# Patient Record
Sex: Female | Born: 1985
Health system: Southern US, Community
[De-identification: ages and names within clinical notes are randomized; demographics above are authoritative.]

## PROBLEM LIST (undated history)

## (undated) DIAGNOSIS — K219 Gastro-esophageal reflux disease without esophagitis: Secondary | ICD-10-CM

---

## 2017-03-28 ENCOUNTER — Encounter (HOSPITAL_BASED_OUTPATIENT_CLINIC_OR_DEPARTMENT_OTHER): Payer: Self-pay

## 2017-03-28 ENCOUNTER — Emergency Department (HOSPITAL_BASED_OUTPATIENT_CLINIC_OR_DEPARTMENT_OTHER)
Admission: EM | Admit: 2017-03-28 | Discharge: 2017-03-28 | Disposition: A | Discharge status: Home / Self Care | Payer: Self-pay | Attending: Emergency Medicine | Admitting: Emergency Medicine

## 2017-03-28 DIAGNOSIS — N912 Amenorrhea, unspecified: Secondary | ICD-10-CM | POA: Insufficient documentation

## 2017-03-28 DIAGNOSIS — B9689 Other specified bacterial agents as the cause of diseases classified elsewhere: Secondary | ICD-10-CM

## 2017-03-28 DIAGNOSIS — N760 Acute vaginitis: Secondary | ICD-10-CM | POA: Insufficient documentation

## 2017-03-28 HISTORY — DX: Gastro-esophageal reflux disease without esophagitis: K21.9

## 2017-03-28 LAB — URINALYSIS, ROUTINE W REFLEX MICROSCOPIC
Bilirubin Urine: NEGATIVE
Glucose, UA: NEGATIVE mg/dL
Hgb urine dipstick: NEGATIVE
Ketones, ur: NEGATIVE mg/dL
Leukocytes, UA: NEGATIVE
NITRITE: NEGATIVE
PH: 6.5 (ref 5.0–8.0)
Protein, ur: NEGATIVE mg/dL
SPECIFIC GRAVITY, URINE: 1.015 (ref 1.005–1.030)

## 2017-03-28 LAB — WET PREP, GENITAL
SPERM: NONE SEEN
Trich, Wet Prep: NONE SEEN
YEAST WET PREP: NONE SEEN

## 2017-03-28 LAB — PREGNANCY, URINE: PREG TEST UR: NEGATIVE

## 2017-03-28 MED ORDER — METRONIDAZOLE 0.75 % VA GEL: 1.0000 | Freq: Two times a day (BID) | VAGINAL | 0 refills | Status: AC

## 2017-03-28 NOTE — ED Triage Notes (Signed)
C/o abd pain x 2 months-denies n/v/d-unknown LMP-NAD-steady gait

## 2017-03-28 NOTE — Discharge Instructions (Signed)
Use MetroGel twice a day for 5 days. You may use Tylenol or ibuprofen as needed for cramping. You may use hot packs to help with symptom control. [facility]. Return to the emergency room if you develop fever, chills, change in bowel or bladder habits, or any new or worsening symptoms.

## 2017-03-30 NOTE — ED Provider Notes (Signed)
[facility]-EMERGENCY DEPT Provider Note   CSN: [id number] Arrival date & time: 03/28/17  1319     History   Chief Complaint Chief Complaint  Patient presents with  . Abdominal Pain    HPI Sherry [name] is a 31 y.o. female presenting with lower abd cramping.   Pt states that at the end of last month when she was supposed to have period, she had lower abd cramping, and her period didn't come. This lasted about a wek. Again this month, she has had lower abd cramping, and no period. She states the cramping is constant, not worse on one side. She normally has regular periods. She is not on birth control pills, implant or IUD. She is sexually active with one female partner. She was checked for STDs 2 months ago prior to moving to [facility], and everything was negative. She reports some irritation after using toys during sex, just prior to the cramping started. She reports some vaginal odor, but no discharge. She denies fevers, chills, CP, SOB, nausea, vomiting, urinary sxs or abnormal BMs. She has no other medical problems.   HPI  Past Medical History:  Diagnosis Date  . GERD (gastroesophageal reflux disease)     There are no active problems to display for this patient.   History reviewed. No pertinent surgical history.  OB History    No data available       Home Medications    Prior to Admission medications   Medication Sig Start Date End Date Taking? Authorizing Provider  metroNIDAZOLE (METROGEL VAGINAL) 0.75 % vaginal gel Place 1 Applicatorful vaginally 2 (two) times daily. 03/28/17 04/02/17  Janilah Hojnacki, PA-C    Family History No family history on file.  Social History Social History  Substance Use Topics  . Smoking status: Never Smoker  . Smokeless tobacco: Never Used  . Alcohol use No     Allergies   Patient has no known allergies.   Review of Systems Review of Systems  Gastrointestinal: Positive for abdominal pain. Negative for constipation, diarrhea, nausea  and vomiting.  Genitourinary: Negative for dysuria, frequency, hematuria and vaginal discharge.       Vaginal odor     Physical Exam Updated Vital Signs BP 120/65 (BP Location: Left Arm)   Pulse 88   Temp 98.8 F (37.1 C) (Oral)   Resp 18   Ht  (1.651 m)   Wt 93.2 kg (205 lb 7.5 oz)   LMP  (LMP Unknown)   SpO2 100%   BMI 34.19 kg/m   Physical Exam  Constitutional: She is oriented to person, place, and time. She appears well-developed and well-nourished. No distress.  HENT:  Head: Normocephalic and atraumatic.  Eyes: EOM are normal.  Neck: Normal range of motion.  Cardiovascular: Normal rate, regular rhythm and intact distal pulses.   Pulmonary/Chest: Effort normal and breath sounds normal. No respiratory distress. She has no wheezes.  Abdominal: Soft. Normal appearance. She exhibits no distension. There is tenderness in the suprapubic area. There is no rigidity, no rebound, no guarding, no CVA tenderness, no tenderness at McBurney's point and negative Murphy's sign.  Minimal ttp of suprapubic area  Genitourinary: Rectum normal, vagina normal and uterus normal. Pelvic exam was performed with patient supine. There is no rash, tenderness or lesion on the right labia. There is no rash, tenderness or lesion on the left labia. Cervix exhibits no motion tenderness, no discharge and no friability. Right adnexum displays no mass, no [id number] fullness. Left adnexum  displays no mass, no tenderness and no fullness.  Genitourinary Comments: Chaperone present. No vaginal discharge. No CMT. No lesions or masses noted.   Musculoskeletal: Normal range of motion.  Neurological: She is alert and oriented to person, place, and time.  Skin: Skin is warm. No rash noted.  Psychiatric: She has a normal mood and affect.  Nursing note and vitals reviewed.    ED Treatments / Results  Labs (all labs ordered are listed, but only abnormal results are displayed) Labs Reviewed  WET PREP,  GENITAL - Abnormal; Notable for the following:       Result Value   Clue Cells Wet Prep HPF POC PRESENT (*)    WBC, Wet Prep HPF POC FEW (*)    All other components within normal limits  URINALYSIS, ROUTINE W REFLEX MICROSCOPIC  PREGNANCY, URINE    EKG  EKG Interpretation None       Radiology No results found.  Procedures Procedures (including critical care time)  Medications Ordered in ED Medications - No data to display   Initial Impression / Assessment and Plan / ED Course  I have reviewed the triage vital signs and the nursing notes.  Pertinent labs & imaging results that were available during my care of the patient were reviewed by me and considered in my medical decision making (see chart for details).     Pt presenting with lower abd cramping x1 wk, with similar episode last month. Additionally, she has not had a period in 2 months, and she has vaginal odor. She does not want to be tested for STDs today, as she had recent testing. Will order UA, pregnancy, and wet prep.  Pelvic without CMT or pain, doubt PID. Doubt torsion. Doubt abd etiology. Wet prep positive for clue cells. UA negative for uti and pregnancy negative. Will rx metrogel for BV. Discussed with pt. Pt to f/u with ob/gyn regarding amenorrhea and for further evaluation. At this time, pt appears safe for discharge. Return precautions given. Pt state she understands and agrees to plan.   Final Clinical Impressions(s) / ED Diagnoses   Final diagnoses:  BV (bacterial vaginosis)  Amenorrhea    New Prescriptions Discharge Medication List as of 03/28/2017  4:01 PM    START taking these medications   Details  metroNIDAZOLE (METROGEL VAGINAL) 0.75 % vaginal gel Place 1 Applicatorful vaginally 2 (two) times daily., Starting Mon 03/28/2017, Until Sat 04/02/2017, Print         Hollister, [place], PA-C 03/30/17 1657    Derwood Kaplan, MD 03/30/17 1814

## 2017-04-01 MED FILL — metroNIDAZOLE 0.75 % GEL: 0.75 | 7 days supply | Qty: 70 | Fill #0

## 2017-08-24 ENCOUNTER — Other Ambulatory Visit: Payer: Self-pay

## 2017-08-24 ENCOUNTER — Emergency Department (HOSPITAL_BASED_OUTPATIENT_CLINIC_OR_DEPARTMENT_OTHER)
Admission: EM | Admit: 2017-08-24 | Discharge: 2017-08-24 | Disposition: A | Discharge status: Home / Self Care | Payer: Self-pay | Attending: Emergency Medicine | Admitting: Emergency Medicine

## 2017-08-24 ENCOUNTER — Encounter (HOSPITAL_BASED_OUTPATIENT_CLINIC_OR_DEPARTMENT_OTHER): Payer: Self-pay | Admitting: Emergency Medicine

## 2017-08-24 DIAGNOSIS — N939 Abnormal uterine and vaginal bleeding, unspecified: Secondary | ICD-10-CM | POA: Insufficient documentation

## 2017-08-24 DIAGNOSIS — R69 Illness, unspecified: Secondary | ICD-10-CM

## 2017-08-24 DIAGNOSIS — J111 Influenza due to unidentified influenza virus with other respiratory manifestations: Secondary | ICD-10-CM | POA: Insufficient documentation

## 2017-08-24 LAB — URINALYSIS, ROUTINE W REFLEX MICROSCOPIC
Bilirubin Urine: NEGATIVE
GLUCOSE, UA: NEGATIVE mg/dL
Ketones, ur: NEGATIVE mg/dL
LEUKOCYTES UA: NEGATIVE
Nitrite: NEGATIVE
PH: 6 (ref 5.0–8.0)
Protein, ur: 30 mg/dL — AB
Specific Gravity, Urine: 1.015 (ref 1.005–1.030)

## 2017-08-24 LAB — PREGNANCY, URINE: Preg Test, Ur: NEGATIVE

## 2017-08-24 LAB — WET PREP, GENITAL
SPERM: NONE SEEN
TRICH WET PREP: NONE SEEN
Yeast Wet Prep HPF POC: NONE SEEN

## 2017-08-24 LAB — URINALYSIS, MICROSCOPIC (REFLEX)

## 2017-08-24 MED ORDER — METRONIDAZOLE 0.75 % VA GEL: 1.0000 | Freq: Two times a day (BID) | VAGINAL | 0 refills | Status: DC

## 2017-08-24 MED ORDER — ONDANSETRON 4 MG PO TBDP: 4.0000 mg | ORAL_TABLET | Freq: Three times a day (TID) | ORAL | 0 refills | Status: DC | PRN

## 2017-08-24 NOTE — ED Provider Notes (Signed)
[facility] EMERGENCY DEPARTMENT Provider Note   CSN: [id number] Arrival date & time: 08/24/17  1524     History   Chief Complaint Chief Complaint  Patient presents with  . Emesis  . Sore Throat  . Generalized Body Aches    HPI Sherry [name] is a 32 y.o. female.  Patient presents to the emergency department with complaint of sore throat, body aches, fever, vomiting starting yesterday.  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  She has had a nonproductive cough.  Also fatigue and generalized weakness.  No treatments prior to arrival.  She received a flu shot 2 weeks ago.  Mild eye drainage. The onset of this condition was acute. The course is constant. Aggravating factors: none. Alleviating factors: none.   Patient also reports some mild vaginal bleeding.  She is concerned about sexually transmitted infection and BV.  States that she is not expecting her period to start for another week or so.      Past Medical History:  Diagnosis Date  . GERD (gastroesophageal reflux disease)     There are no active problems to display for this patient.   History reviewed. No pertinent surgical history.  OB History    No data available       Home Medications    Prior to Admission medications   Not on File    Family History History reviewed. No pertinent family history.  Social History Social History   Tobacco Use  . Smoking status: Never Smoker  . Smokeless tobacco: Never Used  Substance Use Topics  . Alcohol use: No  . Drug use: No     Allergies   Patient has no known allergies.   Review of Systems Review of Systems  Constitutional: Positive for chills, fatigue and fever.  HENT: Positive for congestion and sore throat. Negative for ear pain, rhinorrhea and sinus pressure.   Eyes: Negative for redness.  Respiratory: Positive for cough. Negative for wheezing.   Gastrointestinal: Positive for nausea and vomiting. Negative for  abdominal pain and diarrhea.  Genitourinary: Positive for vaginal bleeding. Negative for dysuria and vaginal discharge.  Musculoskeletal: Positive for myalgias. Negative for neck stiffness.  Skin: Negative for rash.  Neurological: Negative for headaches.  Hematological: Negative for adenopathy.     Physical Exam Updated Vital Signs BP 122/73 (BP Location: Left Arm)   Pulse (!) 105   Temp (!) 100.6 F (38.1 C) (Oral)   Resp 20   Ht 5\' 5"  (1.651 m)   Wt 85.7 kg (189 lb)   LMP 08/23/2017   SpO2 100%   BMI 31.45 kg/m   Physical Exam  Constitutional: She appears well-developed and well-nourished.  HENT:  Head: Normocephalic and atraumatic.  Right Ear: Tympanic membrane, external ear and ear canal normal.  Left Ear: Tympanic membrane, external ear and ear canal normal.  Nose: Nose normal. No mucosal edema or rhinorrhea.  Mouth/Throat: Uvula is midline and mucous membranes are normal. Mucous membranes are not dry. No oral lesions. No trismus in the jaw. No uvula swelling. Posterior oropharyngeal erythema present. No oropharyngeal exudate, posterior oropharyngeal edema or tonsillar abscesses.  Eyes: Conjunctivae are normal. Right eye exhibits no discharge. Left eye exhibits no discharge.  Neck: Normal range of motion. Neck supple.  Cardiovascular: Normal rate, regular rhythm and normal heart sounds.  Pulmonary/Chest: Effort normal and breath sounds normal. No respiratory distress. She has no wheezes. She has no rales.  Abdominal: Soft. There is no tenderness. There is  no rebound and no guarding.  Genitourinary: Uterus normal. Pelvic exam was performed with patient supine. There is no rash, tenderness or lesion on the right labia. There is no rash, tenderness or lesion on the left labia. Uterus is not tender. Cervix exhibits no motion tenderness, no discharge and no friability. Right adnexum displays no mass and no tenderness. Left adnexum displays no mass and no tenderness. There is  bleeding (scant) in the vagina. No erythema or tenderness in the vagina. No signs of injury around the vagina. No vaginal discharge found.  Lymphadenopathy:    She has no cervical adenopathy.  Neurological: She is alert.  Skin: Skin is warm and dry.  Psychiatric: She has a normal mood and affect.  Nursing note and vitals reviewed.    ED Treatments / Results  Labs (all labs ordered are listed, but only abnormal results are displayed) Labs Reviewed  WET PREP, GENITAL - Abnormal; Notable for the following components:      Result Value   Clue Cells Wet Prep HPF POC PRESENT (*)    WBC, Wet Prep HPF POC MODERATE (*)    All other components within normal limits  URINALYSIS, ROUTINE W REFLEX MICROSCOPIC - Abnormal; Notable for the following components:   Hgb urine dipstick MODERATE (*)    Protein, ur 30 (*)    All other components within normal limits  URINALYSIS, MICROSCOPIC (REFLEX) - Abnormal; Notable for the following components:   Bacteria, UA RARE (*)    Squamous Epithelial / LPF 0-5 (*)    All other components within normal limits  PREGNANCY, URINE  GC/CHLAMYDIA PROBE AMP ([place]    Procedures Procedures (including critical care time)  Medications Ordered in ED Medications - No data to display   Initial Impression / Assessment and Plan / ED Course  I have reviewed the triage vital signs and the nursing notes.  Pertinent labs & imaging results that were available during my care of the patient were reviewed by me and considered in my medical decision making (see chart for details).     Patient seen and examined. Work-up initiated.  Will perform pelvic exam.  Symptoms consistent with flu.  Vital signs reviewed and are as follows: BP 122/73 (BP Location: Left Arm)   Pulse (!) 105   Temp (!) 100.6 F (38.1 C) (Oral)   Resp 20   Ht 5\' 5"  (1.651 m)   Wt 85.7 kg (189 lb)   LMP 08/23/2017   SpO2 100%   BMI 31.45 kg/m   Pelvic exam by [place] PA-S  under my direct supervision.  Wet prep with clue cells.  Will give prescription for bacterial vaginosis to fill if patient develops worsening vaginal discharge.  Otherwise would not fill.  Patient discharged to home. Encouraged to rest and drink plenty of fluids.  Patient told to return to ED or see their primary doctor if their symptoms worsen, high fever not controlled with tylenol, persistent vomiting, they feel they are dehydrated, or if they have any other concerns.  Patient verbalized understanding and agreed with plan.     Final Clinical Impressions(s) / ED Diagnoses   Final diagnoses:  Influenza-like illness  Vaginal bleeding   ILI: Patient with symptoms consistent with influenza. Vitals are stable, low-grade fever. No signs of dehydration, tolerating PO's. Lungs are clear. Supportive therapy indicated with return if symptoms worsen. Patient counseled.  Vaginal bleeding: Mild.  Not pregnant.  No pain on exam.  Possible menstrual bleeding?  Less likely BV.  Testing for cervicitis pending.  Patient to follow-up with her GYN if symptoms persist.  No indication for imaging at this point.    ED Discharge Orders        Ordered    metroNIDAZOLE (METROGEL) 0.75 % vaginal gel  2 times daily     08/24/17 1709    ondansetron (ZOFRAN ODT) 4 MG disintegrating tablet  Every 8 hours PRN     08/24/17 1709       Renne Crigler, PA-C 08/24/17 1719    Charlynne Pander, MD 08/24/17 2226

## 2017-08-24 NOTE — Discharge Instructions (Signed)
Please read and follow all provided instructions.  Your diagnoses today include:  1. Influenza-like illness   2. Vaginal bleeding     Tests performed today include:  Wet prep  Urine test -no infection  Test for gonorrhea and chlamydia -negative  Vital signs. See below for your results today.   Medications prescribed:   Metronidazole gel -medication for bacterial vaginosis, fill this and take it if you develop vaginal discharge   Zofran (ondansetron) - for nausea and vomiting  Take any prescribed medications only as directed.  Home care instructions:  Follow any educational materials contained in this packet. Please continue drinking plenty of fluids. Use over-the-counter cold and flu medications as needed as directed on packaging for symptom relief. You may also use ibuprofen or tylenol as directed on packaging for pain or fever.   BE VERY CAREFUL not to take multiple medicines containing Tylenol (also called acetaminophen). Doing so can lead to an overdose which can damage your liver and cause liver failure and possibly death.   Follow-up instructions: Please follow-up with your primary care provider in the next 3 days for further evaluation of your symptoms.   Return instructions:   Please return to the Emergency Department if you experience worsening symptoms.  Please return if you have a high fever greater than 101 degrees not controlled with over-the-counter medications, persistent vomiting and cannot keep down fluids, or worsening trouble breathing.  Please return if you have any other emergent concerns.  Additional Information:  Your vital signs today were: BP 122/73 (BP Location: Left Arm)    Pulse (!) 105    Temp (!) 100.6 F (38.1 C) (Oral)    Resp 20    Ht 5\' 5"  (1.651 m)    Wt 85.7 kg (189 lb)    LMP 08/23/2017    SpO2 100%    BMI 31.45 kg/m  If your blood pressure (BP) was elevated above 135/85 this visit, please have this repeated by your doctor within  one month.

## 2017-08-24 NOTE — ED Triage Notes (Signed)
Pt reports emesis and sore throat last night, flu shot 2 weeks ago. Reports has  Not been feeling well since the vaccine. Gl body aches. Eye drainage. Fever last night

## 2017-08-24 NOTE — ED Notes (Signed)
NAD at this time. Pt is stable and going home.  

## 2017-08-25 LAB — GC/CHLAMYDIA PROBE AMP (~~LOC~~) NOT AT ARMC
Chlamydia: NEGATIVE
NEISSERIA GONORRHEA: NEGATIVE

## 2018-09-04 ENCOUNTER — Encounter (HOSPITAL_BASED_OUTPATIENT_CLINIC_OR_DEPARTMENT_OTHER): Payer: Self-pay | Admitting: *Deleted

## 2018-09-04 ENCOUNTER — Emergency Department (HOSPITAL_BASED_OUTPATIENT_CLINIC_OR_DEPARTMENT_OTHER): Payer: Self-pay

## 2018-09-04 ENCOUNTER — Other Ambulatory Visit: Payer: Self-pay

## 2018-09-04 ENCOUNTER — Emergency Department (HOSPITAL_BASED_OUTPATIENT_CLINIC_OR_DEPARTMENT_OTHER)
Admission: EM | Admit: 2018-09-04 | Discharge: 2018-09-04 | Disposition: A | Discharge status: Home / Self Care | Payer: Self-pay | Attending: Emergency Medicine | Admitting: Emergency Medicine

## 2018-09-04 DIAGNOSIS — B9789 Other viral agents as the cause of diseases classified elsewhere: Secondary | ICD-10-CM | POA: Insufficient documentation

## 2018-09-04 DIAGNOSIS — R0602 Shortness of breath: Secondary | ICD-10-CM | POA: Insufficient documentation

## 2018-09-04 DIAGNOSIS — R51 Headache: Secondary | ICD-10-CM | POA: Insufficient documentation

## 2018-09-04 DIAGNOSIS — F17200 Nicotine dependence, unspecified, uncomplicated: Secondary | ICD-10-CM | POA: Insufficient documentation

## 2018-09-04 DIAGNOSIS — R42 Dizziness and giddiness: Secondary | ICD-10-CM | POA: Insufficient documentation

## 2018-09-04 DIAGNOSIS — J069 Acute upper respiratory infection, unspecified: Secondary | ICD-10-CM

## 2018-09-04 MED ORDER — BENZONATATE 100 MG PO CAPS: 100.0000 mg | ORAL_CAPSULE | Freq: Three times a day (TID) | ORAL | 0 refills | Status: DC | PRN

## 2018-09-04 MED ORDER — ALBUTEROL SULFATE HFA 108 (90 BASE) MCG/ACT IN AERS
2.0000 | INHALATION_SPRAY | Freq: Once | RESPIRATORY_TRACT | Status: AC
  Administered 2018-09-04: 2 via RESPIRATORY_TRACT
  Filled 2018-09-04: qty 6.7

## 2018-09-04 MED ORDER — DEXAMETHASONE SODIUM PHOSPHATE 10 MG/ML IJ SOLN
10.0000 mg | Freq: Once | INTRAMUSCULAR | Status: AC
  Administered 2018-09-04: 10 mg via INTRAMUSCULAR
  Filled 2018-09-04: qty 1

## 2018-09-04 NOTE — ED Provider Notes (Signed)
Emergency Department Provider Note   I have reviewed the triage vital signs and the nursing notes.   HISTORY  Chief Complaint Cough   HPI Sherry [name] is a 33 y.o. female with PMH of GERD presents to the emergency department for evaluation of cough symptoms with headache, shortness of breath.  Symptoms have been ongoing for the past 2 weeks.  She describes minimally productive cough without hemoptysis.  No chest pain.  She reports some lightheadedness which is intermittent.  Mild headache at times.  No body aches.  No sick contacts.  No radiation of symptoms or other modifying factors.  Past Medical History:  Diagnosis Date  . GERD (gastroesophageal reflux disease)     There are no active problems to display for this patient.   History reviewed. No pertinent surgical history.  Allergies Patient has no known allergies.  No family history on file.  Social History Social History   Tobacco Use  . Smoking status: Current Some Day Smoker  . Smokeless tobacco: Never Used  Substance Use Topics  . Alcohol use: No  . Drug use: No    Review of Systems  Constitutional: No fever/chills Eyes: No visual changes. ENT: No sore throat. Cardiovascular: Denies chest pain. Respiratory: Positive shortness of breath. Positive cough and congestion.  Gastrointestinal: No abdominal pain.  No nausea, no vomiting.  No diarrhea.  No constipation. Genitourinary: Negative for dysuria. Musculoskeletal: Negative for back pain. Skin: Negative for rash. Neurological: Negative for focal weakness or numbness. Positive HA.   10-point ROS otherwise negative.  ____________________________________________   PHYSICAL EXAM:  VITAL SIGNS: ED Triage Vitals  Enc Vitals Group     BP 09/04/18 2106 139/90     Pulse Rate 09/04/18 2106 80     Resp 09/04/18 2106 14     Temp 09/04/18 2106 98.2 F (36.8 C)     Temp Source 09/04/18 2106 Oral     SpO2 09/04/18 2106 100 %     Weight 09/04/18 2102  190 lb (86.2 kg)     Height 09/04/18 2102 5\' 5"  (1.651 m)   Constitutional: Alert and oriented. Well appearing and in no acute distress. Eyes: Conjunctivae are normal. PERRL. Head: Atraumatic. Nose: Mild congestion/rhinnorhea. Mouth/Throat: Mucous membranes are moist.  Oropharynx non-erythematous. Neck: No stridor.  Cardiovascular: Normal rate, regular rhythm. Good peripheral circulation. Grossly normal heart sounds.   Respiratory: Normal respiratory effort.  No retractions. Lungs CTAB. Gastrointestinal: Soft and nontender. No distention.  Musculoskeletal: No lower extremity tenderness nor edema. No gross deformities of extremities. Neurologic:  Normal speech and language. No gross focal neurologic deficits are appreciated.  Skin:  Skin is warm, dry and intact. No rash noted.  ____________________________________________  RADIOLOGY  Dg Chest 2 View  Result Date: 09/04/2018 CLINICAL DATA:  Cough, headache, chills, shortness of breath EXAM: CHEST - 2 VIEW COMPARISON:  None. FINDINGS: Heart and mediastinal contours are within normal limits. No focal opacities or effusions. No acute bony abnormality. IMPRESSION: No active cardiopulmonary disease. Electronically Signed   By: Charlett Nose M.D.   On: 09/04/2018 23:00    ____________________________________________   PROCEDURES  Procedure(s) performed:   Procedures  None ____________________________________________   INITIAL IMPRESSION / ASSESSMENT AND PLAN / ED COURSE  Pertinent labs & imaging results that were available during my care of the patient were reviewed by me and considered in my medical decision making (see chart for details).  Patient presents with mild congestion, cough, headache, viral type symptoms for the past 2  weeks.  Chest x-ray obtained given prolonged symptoms which shows no infiltrate.  Suspect bronchitis clinically.  No indication for antibiotics.  I had a Avigdor Dollar discussion regarding the patient's smoking and  advised her that this is likely making her symptoms worse.  Patient is considering quitting but not ready to quit yet.  Offered Decadron and cough suppression medication along with inhaler.  Discussed need for PCP follow-up.  I do not have significant concern for PE or cardiac arrhythmia.  No evidence of infection.   ____________________________________________  FINAL CLINICAL IMPRESSION(S) / ED DIAGNOSES  Final diagnoses:  Viral URI with cough     MEDICATIONS GIVEN DURING THIS VISIT:  Medications  dexamethasone (DECADRON) injection 10 mg (10 mg Intramuscular Given 09/04/18 2321)  albuterol (PROVENTIL HFA;VENTOLIN HFA) 108 (90 Base) MCG/ACT inhaler 2 puff (2 puffs Inhalation Given 09/04/18 2324)     NEW OUTPATIENT MEDICATIONS STARTED DURING THIS VISIT:  Discharge Medication List as of 09/04/2018 11:13 PM    START taking these medications   Details  benzonatate (TESSALON) 100 MG capsule Take 1 capsule (100 mg total) by mouth 3 (three) times daily as needed for cough., Starting Mon 09/04/2018, Print        Note:  This document was prepared using Dragon voice recognition software and may include unintentional dictation errors.  Alona Bene, MD Emergency Medicine    Undray Allman, Arlyss Repress, MD 09/05/18 [id number]

## 2018-09-04 NOTE — ED Triage Notes (Signed)
Cough, headache, chills, and sob x 2 weeks.

## 2018-09-04 NOTE — Discharge Instructions (Signed)

## 2019-03-13 ENCOUNTER — Encounter (HOSPITAL_BASED_OUTPATIENT_CLINIC_OR_DEPARTMENT_OTHER): Payer: Self-pay | Admitting: Emergency Medicine

## 2019-03-13 ENCOUNTER — Emergency Department (HOSPITAL_BASED_OUTPATIENT_CLINIC_OR_DEPARTMENT_OTHER): Payer: Self-pay

## 2019-03-13 ENCOUNTER — Emergency Department (HOSPITAL_BASED_OUTPATIENT_CLINIC_OR_DEPARTMENT_OTHER)
Admission: EM | Admit: 2019-03-13 | Discharge: 2019-03-13 | Disposition: A | Discharge status: Home / Self Care | Payer: Self-pay | Attending: Emergency Medicine | Admitting: Emergency Medicine

## 2019-03-13 ENCOUNTER — Other Ambulatory Visit: Payer: Self-pay

## 2019-03-13 DIAGNOSIS — N12 Tubulo-interstitial nephritis, not specified as acute or chronic: Secondary | ICD-10-CM | POA: Insufficient documentation

## 2019-03-13 DIAGNOSIS — Z87891 Personal history of nicotine dependence: Secondary | ICD-10-CM | POA: Insufficient documentation

## 2019-03-13 LAB — WET PREP, GENITAL
Sperm: NONE SEEN
Trich, Wet Prep: NONE SEEN
WBC, Wet Prep HPF POC: NONE SEEN
Yeast Wet Prep HPF POC: NONE SEEN

## 2019-03-13 LAB — CBC WITH DIFFERENTIAL/PLATELET
Abs Immature Granulocytes: 0.05 10*3/uL (ref 0.00–0.07)
Basophils Absolute: 0.1 10*3/uL (ref 0.0–0.1)
Basophils Relative: 0 %
Eosinophils Absolute: 0.2 10*3/uL (ref 0.0–0.5)
Eosinophils Relative: 1 %
HCT: 32.2 % — ABNORMAL LOW (ref 36.0–46.0)
Hemoglobin: 9.7 g/dL — ABNORMAL LOW (ref 12.0–15.0)
Immature Granulocytes: 0 %
Lymphocytes Relative: 18 %
Lymphs Abs: 2.5 10*3/uL (ref 0.7–4.0)
MCH: 21.1 pg — ABNORMAL LOW (ref 26.0–34.0)
MCHC: 30.1 g/dL (ref 30.0–36.0)
MCV: 70.2 fL — ABNORMAL LOW (ref 80.0–100.0)
Monocytes Absolute: 1.1 10*3/uL — ABNORMAL HIGH (ref 0.1–1.0)
Monocytes Relative: 8 %
Neutro Abs: 10 10*3/uL — ABNORMAL HIGH (ref 1.7–7.7)
Neutrophils Relative %: 73 %
Platelets: 410 10*3/uL — ABNORMAL HIGH (ref 150–400)
RBC: 4.59 MIL/uL (ref 3.87–5.11)
RDW: 19.8 % — ABNORMAL HIGH (ref 11.5–15.5)
WBC: 13.8 10*3/uL — ABNORMAL HIGH (ref 4.0–10.5)
nRBC: 0 % (ref 0.0–0.2)

## 2019-03-13 LAB — BASIC METABOLIC PANEL
Anion gap: 8 (ref 5–15)
BUN: 8 mg/dL (ref 6–20)
CO2: 25 mmol/L (ref 22–32)
Calcium: 8.1 mg/dL — ABNORMAL LOW (ref 8.9–10.3)
Chloride: 104 mmol/L (ref 98–111)
Creatinine, Ser: 0.83 mg/dL (ref 0.44–1.00)
GFR calc Af Amer: >60 mL/min (ref >60)
GFR calc non Af Amer: >60 mL/min (ref >60)
Glucose, Bld: 112 mg/dL — ABNORMAL HIGH (ref 70–99)
Potassium: 3.5 mmol/L (ref 3.5–5.1)
Sodium: 137 mmol/L (ref 135–145)

## 2019-03-13 LAB — PREGNANCY, URINE: Preg Test, Ur: NEGATIVE

## 2019-03-13 LAB — URINALYSIS, MICROSCOPIC (REFLEX)

## 2019-03-13 LAB — URINALYSIS, ROUTINE W REFLEX MICROSCOPIC
Bilirubin Urine: NEGATIVE
Glucose, UA: NEGATIVE mg/dL
Ketones, ur: NEGATIVE mg/dL
Nitrite: NEGATIVE
Protein, ur: 30 mg/dL — AB
Specific Gravity, Urine: 1.02 (ref 1.005–1.030)
pH: 7 (ref 5.0–8.0)

## 2019-03-13 MED ORDER — SODIUM CHLORIDE 0.9 % IV BOLUS
1000.0000 mL | Freq: Once | INTRAVENOUS | Status: AC
  Administered 2019-03-13: 1000 mL via INTRAVENOUS

## 2019-03-13 MED ORDER — SODIUM CHLORIDE 0.9 % IV SOLN
1.0000 g | Freq: Once | INTRAVENOUS | Status: AC
  Administered 2019-03-13: 1 g via INTRAVENOUS
  Filled 2019-03-13: qty 10

## 2019-03-13 MED ORDER — PHENAZOPYRIDINE HCL 100 MG PO TABS
200.0000 mg | ORAL_TABLET | Freq: Once | ORAL | Status: AC
  Administered 2019-03-13: 06:00:00 200 mg via ORAL
  Filled 2019-03-13: qty 2

## 2019-03-13 MED ORDER — METOCLOPRAMIDE HCL 10 MG PO TABS: 10.0000 mg | ORAL_TABLET | Freq: Four times a day (QID) | ORAL | 0 refills | Status: DC | PRN

## 2019-03-13 MED ORDER — ONDANSETRON HCL 4 MG/2ML IJ SOLN
4.0000 mg | Freq: Once | INTRAMUSCULAR | Status: AC
  Administered 2019-03-13: 4 mg via INTRAVENOUS
  Filled 2019-03-13: qty 2

## 2019-03-13 MED ORDER — FENTANYL CITRATE (PF) 100 MCG/2ML IJ SOLN
100.0000 ug | Freq: Once | INTRAMUSCULAR | Status: AC
  Administered 2019-03-13: 100 ug via INTRAVENOUS
  Filled 2019-03-13: qty 2

## 2019-03-13 MED ORDER — CIPROFLOXACIN HCL 500 MG PO TABS: 500.0000 mg | ORAL_TABLET | Freq: Two times a day (BID) | ORAL | 0 refills | Status: DC

## 2019-03-13 MED ORDER — PHENAZOPYRIDINE HCL 200 MG PO TABS: 200.0000 mg | ORAL_TABLET | Freq: Three times a day (TID) | ORAL | 0 refills | Status: DC | PRN

## 2019-03-13 NOTE — ED Provider Notes (Signed)
[facility] Provider Note: Lowella DellJ. Lane Aram Domzalski, MD, FACEP  CSN: [id number] MRN: [id number] ARRIVAL: 03/13/19 at 0454 ROOM: MH09/MH09   CHIEF COMPLAINT  Abdominal Pain and Back Pain   HISTORY OF PRESENT ILLNESS  03/13/19 5:11 AM Sherry [name] is a 33 y.o. female with 3 days of generalized pain which involves her flanks bilaterally.  She rates her pain as a 10 out of 10.  It has subsequently been associated with lower abdominal pain as well.  Her pain is worse with movement or palpation.  She denies fever, chills, vomiting or diarrhea but has had nausea.  She has noticed some blood in her urine and tingling when she urinates.  She is also been constipated, last bowel movement was about 3 days ago.   Past Medical History:  Diagnosis Date  . GERD (gastroesophageal reflux disease)     History reviewed. No pertinent surgical history.  History reviewed. No pertinent family history.  Social History   Tobacco Use  . Smoking status: Former Smoker    Types: Cigarettes    Quit date: 12/11/2018    Years since quitting: 0.2  . Smokeless tobacco: Never Used  Substance Use Topics  . Alcohol use: No  . Drug use: No    Prior to Admission medications   Medication Sig Start Date End Date Taking? Authorizing Provider  ciprofloxacin (CIPRO) 500 MG tablet Take 1 tablet (500 mg total) by mouth 2 (two) times daily. One po bid x 7 days 03/13/19   Jasaiah Karwowski, Jonny RuizJohn, MD  metoCLOPramide (REGLAN) 10 MG tablet Take 1 tablet (10 mg total) by mouth every 6 (six) hours as needed for nausea or vomiting (nausea/headache). 03/13/19   Madaleine Simmon, MD  phenazopyridine (PYRIDIUM) 200 MG tablet Take 1 tablet (200 mg total) by mouth 3 (three) times daily as needed for pain. 03/13/19   Demetrica Zipp, MD    Allergies Patient has no known allergies.   REVIEW OF SYSTEMS  Negative except as noted here or in the History of Present Illness.   PHYSICAL EXAMINATION  Initial Vital Signs Blood pressure (!) 154/94,  pulse 84, temperature 99.4 F (37.4 C), temperature source Oral, resp. rate 20, height 5\' 5"  (1.651 m), weight 90.7 kg, last menstrual period 02/18/2019, SpO2 100 %.  Examination General: Well-developed, well-nourished female in no acute distress; appearance consistent with age of record HENT: normocephalic; atraumatic Eyes: pupils equal, round and reactive to light; extraocular muscles intact Neck: supple Heart: regular rate and rhythm Lungs: clear to auscultation bilaterally Abdomen: soft; nondistended; suprapubic tenderness; bowel sounds present GU: Normal external genitalia; physiologic appearing vaginal discharge; no vaginal bleeding; no cervical motion tenderness; bladder tenderness Back: Generalized tenderness including the flanks bilaterally Extremities: No deformity; full range of motion; pulses normal Neurologic: Awake, alert and oriented; motor function intact in all extremities and symmetric; no facial droop Skin: Warm and dry Psychiatric: Tearful   RESULTS  Summary of this visit's results, reviewed by myself:   EKG Interpretation  Date/Time:    Ventricular Rate:    PR Interval:    QRS Duration:   QT Interval:    QTC Calculation:   R Axis:     Text Interpretation:        Laboratory Studies: Results for orders placed or performed during the hospital encounter of 03/13/19 (from the past 24 hour(s))  Urinalysis, Routine w reflex microscopic     Status: Abnormal   Collection Time: 03/13/19  5:40 AM  Result Value Ref Range   Color,  Urine STRAW (A) YELLOW   APPearance HAZY (A) CLEAR   Specific Gravity, Urine 1.020 1.005 - 1.030   pH 7.0 5.0 - 8.0   Glucose, UA NEGATIVE NEGATIVE mg/dL   Hgb urine dipstick LARGE (A) NEGATIVE   Bilirubin Urine NEGATIVE NEGATIVE   Ketones, ur NEGATIVE NEGATIVE mg/dL   Protein, ur 30 (A) NEGATIVE mg/dL   Nitrite NEGATIVE NEGATIVE   Leukocytes,Ua SMALL (A) NEGATIVE  Pregnancy, urine     Status: None   Collection Time: 03/13/19   5:40 AM  Result Value Ref Range   Preg Test, Ur NEGATIVE NEGATIVE  CBC with Differential/Platelet     Status: Abnormal   Collection Time: 03/13/19  5:40 AM  Result Value Ref Range   WBC 13.8 (H) 4.0 - 10.5 K/uL   RBC 4.59 3.87 - 5.11 MIL/uL   Hemoglobin 9.7 (L) 12.0 - 15.0 g/dL   HCT 32.2 (L) 36.0 - 46.0 %   MCV 70.2 (L) 80.0 - 100.0 fL   MCH 21.1 (L) 26.0 - 34.0 pg   MCHC 30.1 30.0 - 36.0 g/dL   RDW 19.8 (H) 11.5 - 15.5 %   Platelets 410 (H) 150 - 400 K/uL   nRBC 0.0 0.0 - 0.2 %   Neutrophils Relative % 73 %   Neutro Abs 10.0 (H) 1.7 - 7.7 K/uL   Lymphocytes Relative 18 %   Lymphs Abs 2.5 0.7 - 4.0 K/uL   Monocytes Relative 8 %   Monocytes Absolute 1.1 (H) 0.1 - 1.0 K/uL   Eosinophils Relative 1 %   Eosinophils Absolute 0.2 0.0 - 0.5 K/uL   Basophils Relative 0 %   Basophils Absolute 0.1 0.0 - 0.1 K/uL   Immature Granulocytes 0 %   Abs Immature Granulocytes 0.05 0.00 - 0.07 K/uL  Basic metabolic panel     Status: Abnormal   Collection Time: 03/13/19  5:40 AM  Result Value Ref Range   Sodium 137 135 - 145 mmol/L   Potassium 3.5 3.5 - 5.1 mmol/L   Chloride 104 98 - 111 mmol/L   CO2 25 22 - 32 mmol/L   Glucose, Bld 112 (H) 70 - 99 mg/dL   BUN 8 6 - 20 mg/dL   Creatinine, Ser 0.83 0.44 - 1.00 mg/dL   Calcium 8.1 (L) 8.9 - 10.3 mg/dL   GFR calc non Af Amer >60 >60 mL/min   GFR calc Af Amer >60 >60 mL/min   Anion gap 8 5 - 15  Urinalysis, Microscopic (reflex)     Status: Abnormal   Collection Time: 03/13/19  5:40 AM  Result Value Ref Range   RBC / HPF 11-20 0 - 5 RBC/hpf   WBC, UA 11-20 0 - 5 WBC/hpf   Bacteria, UA MANY (A) NONE SEEN   Squamous Epithelial / LPF 0-5 0 - 5   WBC Clumps PRESENT   Wet prep, genital     Status: Abnormal   Collection Time: 03/13/19  6:07 AM   Specimen: Cervical/Vaginal swab  Result Value Ref Range   Yeast Wet Prep HPF POC NONE SEEN NONE SEEN   Trich, Wet Prep NONE SEEN NONE SEEN   Clue Cells Wet Prep HPF POC PRESENT (A) NONE SEEN   WBC,  Wet Prep HPF POC NONE SEEN NONE SEEN   Sperm NONE SEEN    Imaging Studies: Dg Abdomen 1 View  Result Date: 03/13/2019 CLINICAL DATA:  Abdominal pain with low back pain for 3 days. Initial encounter. EXAM: ABDOMEN - 1 VIEW  COMPARISON:  None. FINDINGS: The bowel gas pattern is normal. No radio-opaque calculi or other significant radiographic abnormality are seen. IMPRESSION: Negative one view abdomen. Electronically Signed   By: Marin Roberts M.D.   On: 03/13/2019 06:36    ED COURSE and MDM  Nursing notes and initial vitals signs, including pulse oximetry, reviewed.  Vitals:   03/13/19 0503 03/13/19 0504  BP: (!) 154/94   Pulse: 84   Resp: 20   Temp: 99.4 F (37.4 C)   TempSrc: Oral   SpO2: 100%   Weight:  90.7 kg  Height:  5\' 5"  (1.651 m)   6:29 AM History and examination suspicious for pyelonephritis.  Rocephin 1 g IV given, urine sent for culture.  Patient remains nontoxic-appearing, plan to treat as an outpatient.   PROCEDURES    ED DIAGNOSES     ICD-10-CM   1. Pyelonephritis  N12        Savita Runner, MD 03/13/19 [phone]

## 2019-03-13 NOTE — ED Triage Notes (Signed)
Patient arrived via POV c/o abdominal and lower back pain x 3 days. Pain started in lower back, then progressed to abdomen. Patient states she's been unable to have a bowel movement for the last 2 days. Patient is AO x 4, elevated BP. Hunched over gait.

## 2019-03-14 LAB — GC/CHLAMYDIA PROBE AMP (~~LOC~~) NOT AT ARMC
Chlamydia: NEGATIVE
Neisseria Gonorrhea: NEGATIVE

## 2019-03-15 LAB — URINE CULTURE: Culture: >=100000 — AB

## 2019-03-16 ENCOUNTER — Telehealth: Payer: Self-pay

## 2019-03-16 NOTE — Telephone Encounter (Signed)
Post ED Visit - Positive Culture Follow-up  Culture report reviewed by antimicrobial stewardship pharmacist: [facility] []  [address], Pharm.D. []  Heide Guile, Pharm.D., BCPS AQ-ID []  Parks Neptune, Pharm.D., BCPS []  Alycia Rossetti, Pharm.D., BCPS []  [place], Pharm.D., BCPS, AAHIVP []  Legrand Como, Pharm.D., BCPS, AAHIVP []  Salome Arnt, PharmD, BCPS []  Johnnette Gourd, PharmD, BCPS []  Hughes Better, PharmD, BCPS []  Leeroy Cha, PharmD []  Laqueta Linden, PharmD, BCPS []  Albertina Parr, PharmD [facility] []  Leodis Sias, PharmD []  Lindell Spar, PharmD []  Royetta Asal, PharmD []  Graylin Shiver, Rph []  Rema Fendt) Glennon Mac, PharmD []  Arlyn Dunning, PharmD []  Netta Cedars, PharmD []  Dia Sitter, PharmD []  Leone Haven, PharmD []  Gretta Arab, PharmD []  Theodis Shove, PharmD []  Peggyann Juba, PharmD []  Reuel Boom, PharmD   Positive urine culture Treated with Cipro, organism sensitive to the same and no further patient follow-up is required at this time.  Genia Del 03/16/2019, 9:34 AM

## 2019-04-14 ENCOUNTER — Emergency Department (HOSPITAL_BASED_OUTPATIENT_CLINIC_OR_DEPARTMENT_OTHER)
Admission: EM | Admit: 2019-04-14 | Discharge: 2019-04-14 | Disposition: A | Discharge status: Home / Self Care | Payer: Self-pay | Attending: Emergency Medicine | Admitting: Emergency Medicine

## 2019-04-14 ENCOUNTER — Other Ambulatory Visit: Payer: Self-pay

## 2019-04-14 ENCOUNTER — Encounter (HOSPITAL_BASED_OUTPATIENT_CLINIC_OR_DEPARTMENT_OTHER): Payer: Self-pay | Admitting: Emergency Medicine

## 2019-04-14 DIAGNOSIS — K047 Periapical abscess without sinus: Secondary | ICD-10-CM | POA: Insufficient documentation

## 2019-04-14 DIAGNOSIS — Z87891 Personal history of nicotine dependence: Secondary | ICD-10-CM | POA: Insufficient documentation

## 2019-04-14 MED ORDER — BUPIVACAINE-EPINEPHRINE (PF) 0.5% -1:200000 IJ SOLN
1.8000 mL | Freq: Once | INTRAMUSCULAR | Status: AC
  Administered 2019-04-14: 23:00:00 1.8 mL

## 2019-04-14 MED ORDER — CLINDAMYCIN HCL 300 MG PO CAPS: 300.0000 mg | ORAL_CAPSULE | Freq: Four times a day (QID) | ORAL | 0 refills | Status: DC

## 2019-04-14 MED ORDER — HYDROCODONE-ACETAMINOPHEN 5-325 MG PO TABS: 1.0000 | ORAL_TABLET | ORAL | 0 refills | Status: DC | PRN

## 2019-04-14 MED ORDER — BUPIVACAINE-EPINEPHRINE (PF) 0.5% -1:200000 IJ SOLN
INTRAMUSCULAR | Status: AC
  Filled 2019-04-14: qty 1.8

## 2019-04-14 NOTE — ED Provider Notes (Signed)
[place] [facility] Provider Note: Georgena Spurling, MD, FACEP  CSN: [id number] MRN: [id number] ARRIVAL: 04/14/19 at [place]: Between  Abscess (dental)   HISTORY OF PRESENT ILLNESS  04/14/19 11:06 PM Sherry [name] is a 33 y.o. female with a 2-day history of pain associated with her right lower third molar.  There has been associated purulent drainage and blood.  She reports her pain is a 7 out of 10 and radiates to the right side of her mouth.  She has taken over-the-counter ibuprofen without relief.  Pain is worse with eating or drinking.  She is also having some pain medial to her left scapula which is mild, worse with movement or palpation.  She denies injury.   Past Medical History:  Diagnosis Date  . GERD (gastroesophageal reflux disease)     History reviewed. No pertinent surgical history.  No family history on file.  Social History   Tobacco Use  . Smoking status: Former Smoker    Types: Cigarettes    Quit date: 12/11/2018    Years since quitting: 0.3  . Smokeless tobacco: Never Used  Substance Use Topics  . Alcohol use: No  . Drug use: No    Prior to Admission medications   Medication Sig Start Date End Date Taking? Authorizing Provider  ciprofloxacin (CIPRO) 500 MG tablet Take 1 tablet (500 mg total) by mouth 2 (two) times daily. One po bid x 7 days 03/13/19   Rafia Shedden, Jenny Reichmann, MD  metoCLOPramide (REGLAN) 10 MG tablet Take 1 tablet (10 mg total) by mouth every 6 (six) hours as needed for nausea or vomiting (nausea/headache). 03/13/19   Amarachi Kotz, MD  phenazopyridine (PYRIDIUM) 200 MG tablet Take 1 tablet (200 mg total) by mouth 3 (three) times daily as needed for pain. 03/13/19   Yvone Slape, MD    Allergies Patient has no known allergies.   REVIEW OF SYSTEMS  Negative except as noted here or in the History of Present Illness.   PHYSICAL EXAMINATION  Initial Vital Signs Blood pressure 139/85, pulse 88, temperature 98.3 F (36.8 C),  temperature source Oral, resp. rate 20, height 5\' 5"  (1.651 m), weight 86.2 kg, last menstrual period 03/22/2019, SpO2 100 %.  Examination General: Well-developed, well-nourished female in no acute distress; appearance consistent with age of record HENT: normocephalic; atraumatic; swelling and purulent drainage adjacent to the right lower third molar Eyes: pupils equal, round and reactive to light; extraocular muscles intact Neck: supple; no lymphadenopathy Heart: regular rate and rhythm Lungs: clear to auscultation bilaterally Abdomen: soft; nondistended; nontender; bowel sounds present Back: Mild soft tissue tenderness medial to left scapula Extremities: No deformity; full range of motion; pulses normal Neurologic: Awake, alert and oriented; motor function intact in all extremities and symmetric; no facial droop Skin: Warm and dry Psychiatric: Anxious   RESULTS  Summary of this visit's results, reviewed by myself:   EKG Interpretation  Date/Time:    Ventricular Rate:    PR Interval:    QRS Duration:   QT Interval:    QTC Calculation:   R Axis:     Text Interpretation:        Laboratory Studies: No results found for this or any previous visit (from the past 24 hour(s)). Imaging Studies: No results found.  ED COURSE and MDM  Nursing notes and initial vitals signs, including pulse oximetry, reviewed.  Vitals:   04/14/19 2232  BP: 139/85  Pulse: 88  Resp: 20  Temp: 98.3 F (  36.8 C)  TempSrc: Oral  SpO2: 100%  Weight: 86.2 kg  Height: 5\' 5"  (1.651 m)   Symptoms and examination consistent with an abscess associated with a right lower third molar.  We will start antibiotics and refer to oral surgery.  PROCEDURES   DENTAL BLOCK 1.8 milliliters of 0.5% bupivacaine with epinephrine were injected into the buccal fold adjacent to the right lower third molar. The patient tolerated this well and there were no immediate complications. Adequate analgesia was obtained.    ED DIAGNOSES     ICD-10-CM   1. Dental abscess  K04.7        , MD 04/14/19 2329

## 2019-04-14 NOTE — ED Triage Notes (Signed)
Pt reports dental abscess to R lower mouth that has been draining pus. States odor/"nasty" taste in mouth. States took 800mg  ibuprofen last night without relief, took 400mg  ibuprofen today without relief. Also reporting pain in L shoulder that started about the same time.

## 2020-02-24 IMAGING — DX DG CHEST 2V
2 series · 2 of 2 positions shown · non-contrast
Comparison: None.

CLINICAL DATA: Cough, headache, chills, shortness of breath

EXAM:
CHEST - 2 VIEW

[chest pa]
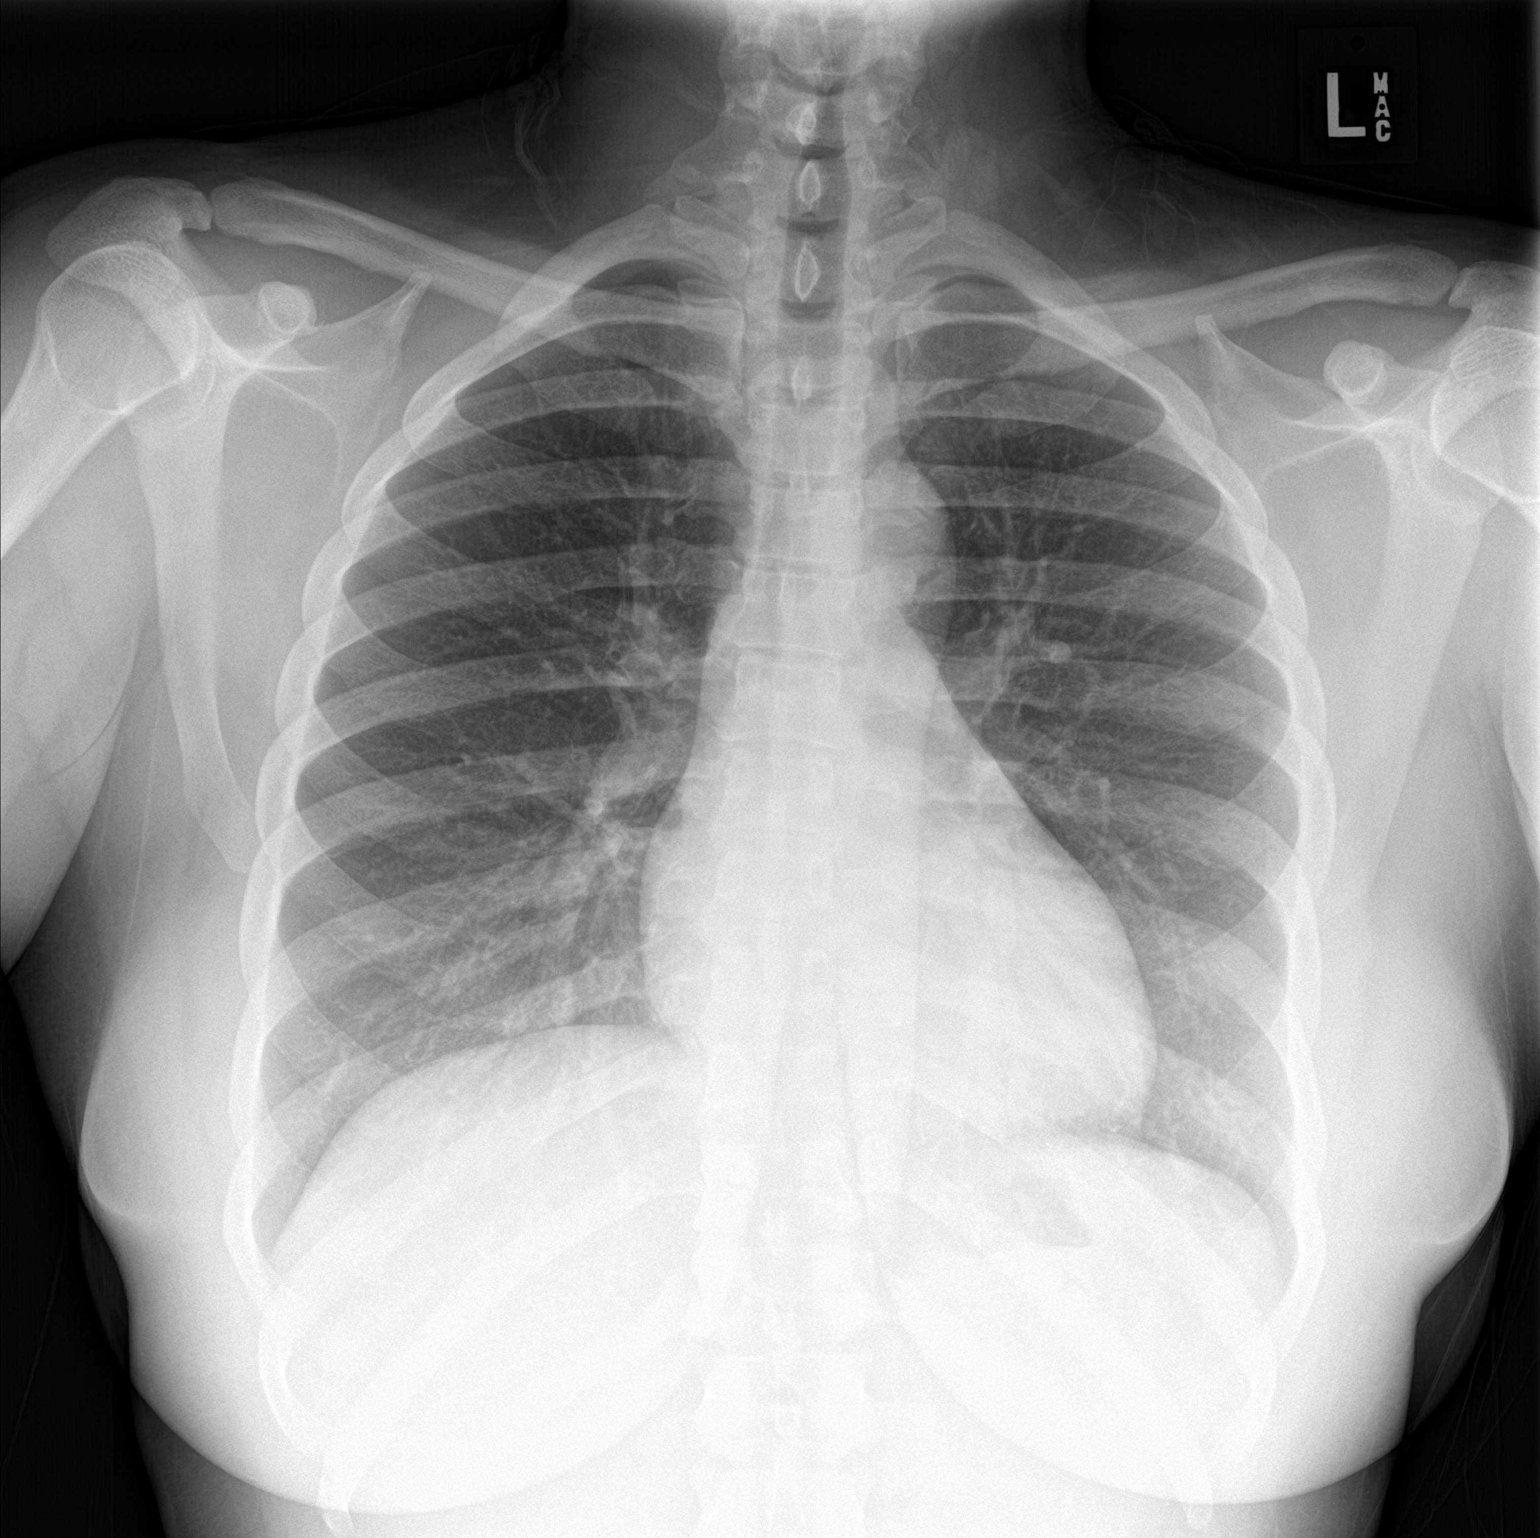

[chest lat]
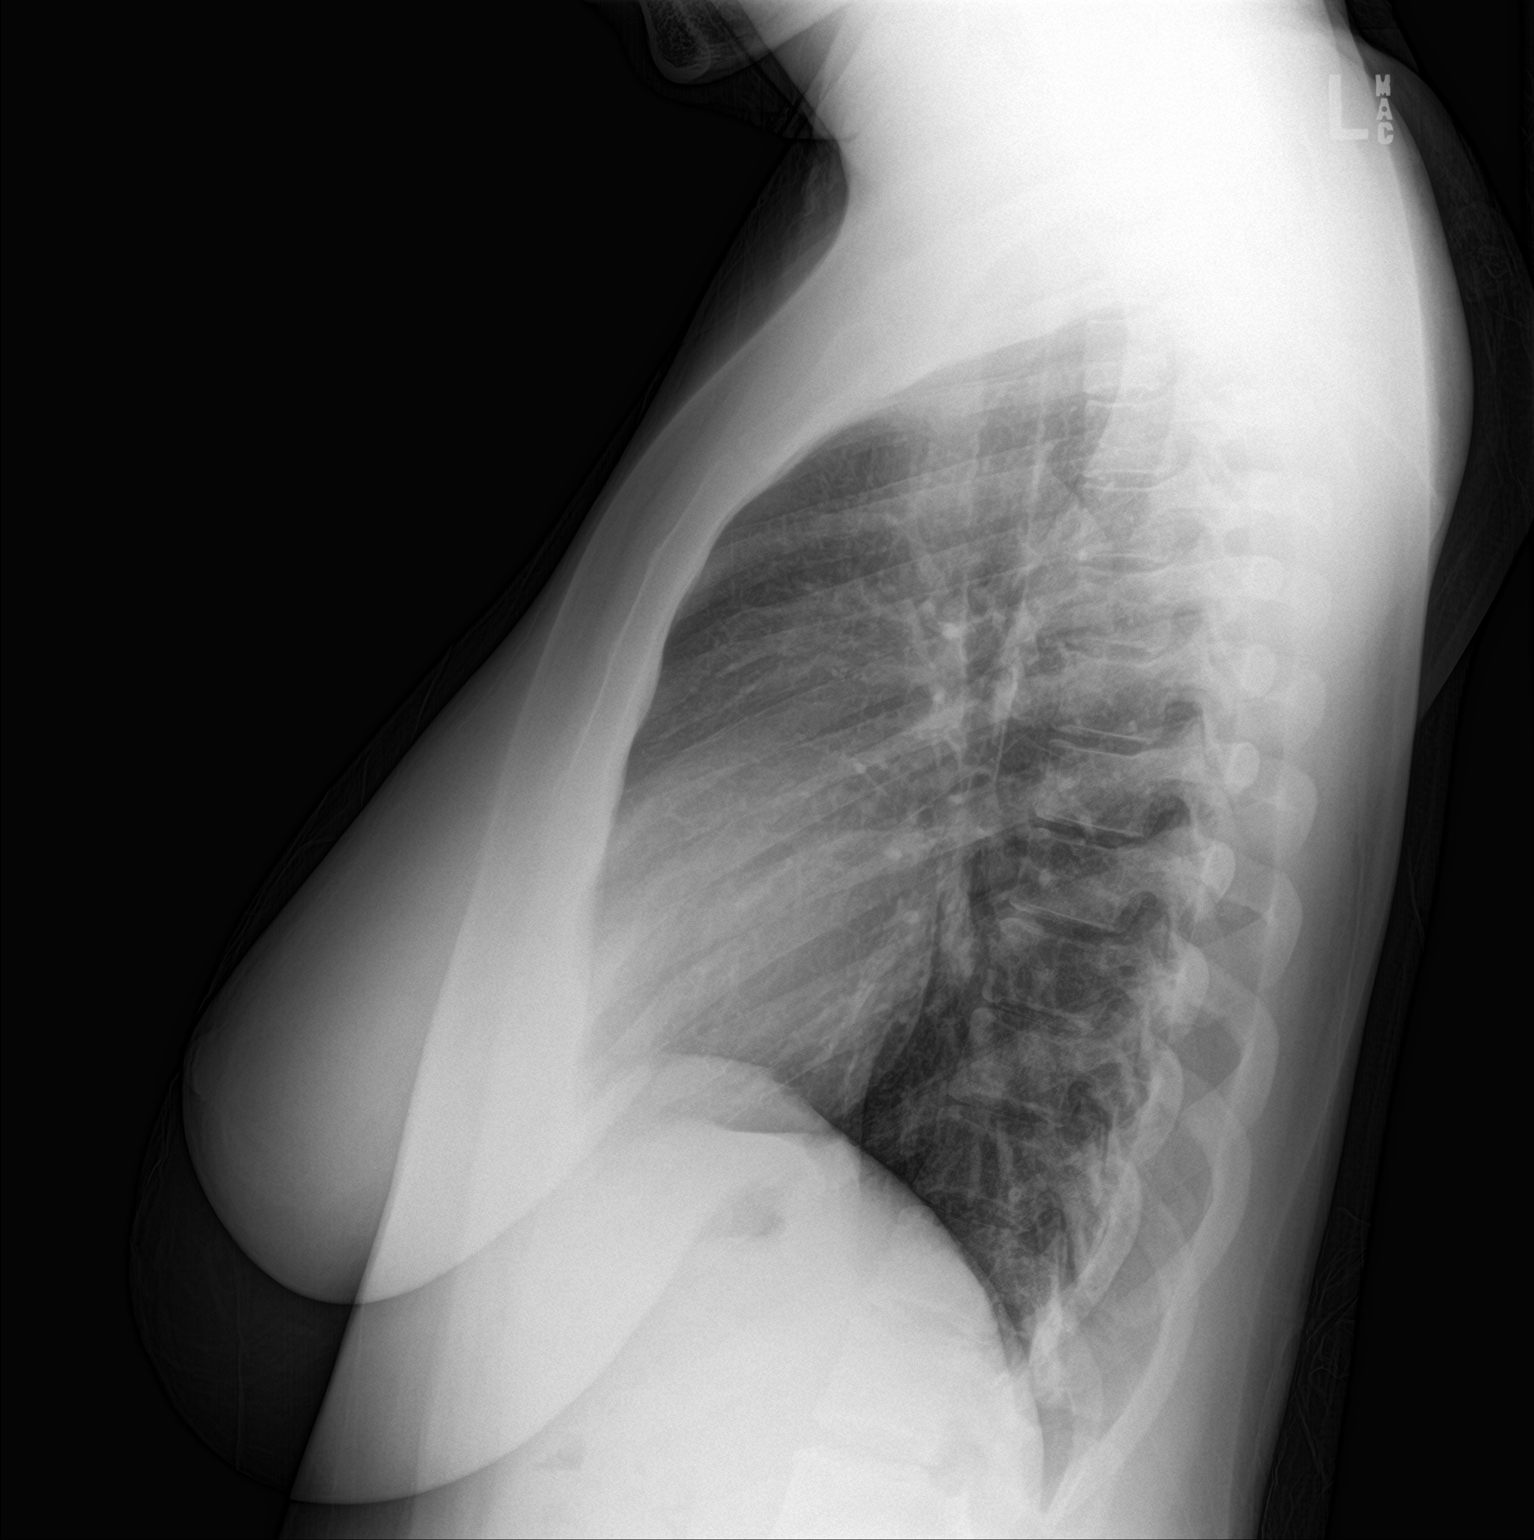

[2 of 2 positions shown; findings below may reference images not displayed]

FINDINGS: Heart and mediastinal contours are within normal limits. No focal
opacities or effusions. No acute bony abnormality.
IMPRESSION: No active cardiopulmonary disease.

## 2020-04-03 ENCOUNTER — Other Ambulatory Visit: Payer: Self-pay

## 2020-04-03 ENCOUNTER — Emergency Department (HOSPITAL_BASED_OUTPATIENT_CLINIC_OR_DEPARTMENT_OTHER)
Admission: EM | Admit: 2020-04-03 | Discharge: 2020-04-03 | Disposition: A | Discharge status: Home / Self Care | Payer: Self-pay | Attending: Emergency Medicine | Admitting: Emergency Medicine

## 2020-04-03 ENCOUNTER — Encounter (HOSPITAL_BASED_OUTPATIENT_CLINIC_OR_DEPARTMENT_OTHER): Payer: Self-pay | Admitting: Emergency Medicine

## 2020-04-03 ENCOUNTER — Emergency Department (HOSPITAL_BASED_OUTPATIENT_CLINIC_OR_DEPARTMENT_OTHER): Payer: Self-pay

## 2020-04-03 DIAGNOSIS — J069 Acute upper respiratory infection, unspecified: Secondary | ICD-10-CM

## 2020-04-03 DIAGNOSIS — R059 Cough, unspecified: Secondary | ICD-10-CM

## 2020-04-03 DIAGNOSIS — Z79899 Other long term (current) drug therapy: Secondary | ICD-10-CM | POA: Insufficient documentation

## 2020-04-03 DIAGNOSIS — R05 Cough: Secondary | ICD-10-CM | POA: Insufficient documentation

## 2020-04-03 DIAGNOSIS — D509 Iron deficiency anemia, unspecified: Secondary | ICD-10-CM

## 2020-04-03 DIAGNOSIS — R197 Diarrhea, unspecified: Secondary | ICD-10-CM | POA: Insufficient documentation

## 2020-04-03 DIAGNOSIS — Z87891 Personal history of nicotine dependence: Secondary | ICD-10-CM | POA: Insufficient documentation

## 2020-04-03 DIAGNOSIS — R590 Localized enlarged lymph nodes: Secondary | ICD-10-CM | POA: Insufficient documentation

## 2020-04-03 DIAGNOSIS — Z20822 Contact with and (suspected) exposure to covid-19: Secondary | ICD-10-CM | POA: Insufficient documentation

## 2020-04-03 LAB — CBC
HCT: 34.4 % — ABNORMAL LOW (ref 36.0–46.0)
Hemoglobin: 10.2 g/dL — ABNORMAL LOW (ref 12.0–15.0)
MCH: 20.2 pg — ABNORMAL LOW (ref 26.0–34.0)
MCHC: 29.7 g/dL — ABNORMAL LOW (ref 30.0–36.0)
MCV: 68 fL — ABNORMAL LOW (ref 80.0–100.0)
Platelets: 400 10*3/uL (ref 150–400)
RBC: 5.06 MIL/uL (ref 3.87–5.11)
RDW: 20.8 % — ABNORMAL HIGH (ref 11.5–15.5)
WBC: 9.3 10*3/uL (ref 4.0–10.5)
nRBC: 0 % (ref 0.0–0.2)

## 2020-04-03 LAB — RESPIRATORY PANEL BY RT PCR (FLU A&B, COVID)
Influenza A by PCR: NEGATIVE
Influenza B by PCR: NEGATIVE
SARS Coronavirus 2 by RT PCR: NEGATIVE

## 2020-04-03 LAB — BASIC METABOLIC PANEL
Anion gap: 12 (ref 5–15)
BUN: 12 mg/dL (ref 6–20)
CO2: 23 mmol/L (ref 22–32)
Calcium: 8.5 mg/dL — ABNORMAL LOW (ref 8.9–10.3)
Chloride: 103 mmol/L (ref 98–111)
Creatinine, Ser: 0.76 mg/dL (ref 0.44–1.00)
GFR calc Af Amer: >60 mL/min (ref >60)
GFR calc non Af Amer: >60 mL/min (ref >60)
Glucose, Bld: 118 mg/dL — ABNORMAL HIGH (ref 70–99)
Potassium: 3.9 mmol/L (ref 3.5–5.1)
Sodium: 138 mmol/L (ref 135–145)

## 2020-04-03 NOTE — ED Triage Notes (Signed)
Pt reports cough x 2 days , brown/bloody sputum . denies sore throat nor congestion.

## 2020-04-03 NOTE — Discharge Instructions (Addendum)
Your lungs sound clear, you have normal vital signs and have a normal chest xray. Your COVID test is pending and you will be contacted if you have a positive result.  Please isolate until results come back. Also, you have anemia which looks chronic and unlikely from your cough. I recommend you take iron supplements as described in the attachment.  Follow up with your PCP as needed.

## 2020-04-03 NOTE — ED Triage Notes (Signed)
Pt adds diarrhea started today "foul stools"

## 2020-04-03 NOTE — ED Provider Notes (Addendum)
[facility] EMERGENCY DEPARTMENT Provider Note   CSN: [id number] Arrival date & time: 04/03/20  0831  History Chief Complaint  Patient presents with   Cough    Sherry [name] is a 34 y.o. female.  34 year old female has had 2 weeks of yellow phlegm production but no rhinorrhea, sore throat, cough.  She thinks that there was streaks of blood in the yellow phlegm.  For the past 2 days she has had a cough and the phlegm has turned into a red and dark brown color which she fears is blood.  Continues to not have any sore throat or rhinorrhea, denies fevers, sinus pressure, nausea, vomiting, decreased appetite.  Had one episode of foul-smelling diarrhea this morning which was nonbloody.  Patient is not Covid vaccinated but denies sick contacts.  She is not a smoker but has secondhand smoke exposure.  She denies frequent alcohol use but will occasionally drink a glass of wine on the weekends.  She otherwise feels well.    Past Medical History:  Diagnosis Date   GERD (gastroesophageal reflux disease)    There are no problems to display for this patient.  History reviewed. No pertinent surgical history.   OB History   No obstetric history on file.    No family history on file.  Social History   Tobacco Use   Smoking status: Former Smoker    Types: Cigarettes    Quit date: 12/11/2018    Years since quitting: 1.3   Smokeless tobacco: Never Used  Vaping Use   Vaping Use: Never used  Substance Use Topics   Alcohol use: No   Drug use: No   Home Medications Prior to Admission medications   Medication Sig Start Date End Date Taking? Authorizing Provider  clindamycin (CLEOCIN) 300 MG capsule Take 1 capsule (300 mg total) by mouth 4 (four) times daily. X 7 days 04/14/19   Molpus, John, MD  HYDROcodone-acetaminophen (NORCO) 5-325 MG tablet Take 1 tablet by mouth every 4 (four) hours as needed for severe pain. 04/14/19   Molpus, John, MD  metoCLOPramide (REGLAN) 10 MG  tablet Take 1 tablet (10 mg total) by mouth every 6 (six) hours as needed for nausea or vomiting (nausea/headache). 03/13/19   Molpus, John, MD    Allergies    Patient has no known allergies.  Review of Systems   Review of Systems  Physical Exam Updated Vital Signs BP (!) 149/99    Pulse 98    Temp 98.4 F (36.9 C) (Oral)    Resp 18    LMP 04/03/2020    SpO2 100%   Physical Exam Vitals and nursing note reviewed.  Constitutional:      General: She is not in acute distress.    Appearance: Normal appearance. She is obese. She is not ill-appearing or toxic-appearing.  HENT:     Head: Normocephalic.     Nose: Nose normal. No congestion or rhinorrhea.     Mouth/Throat:     Mouth: Mucous membranes are moist.     Pharynx: Oropharynx is clear. No oropharyngeal exudate or posterior oropharyngeal erythema.     Comments: Positive for tooth decay in upper back molar on left. Non-bleeding. Tender to probing. Negative TMJ tenderness Eyes:     Conjunctiva/sclera: Conjunctivae normal.     Pupils: Pupils are equal, round, and reactive to light.  Cardiovascular:     Rate and Rhythm: Normal rate and regular rhythm.     Pulses: Normal pulses.  Pulmonary:  Effort: Pulmonary effort is normal. No respiratory distress.     Breath sounds: Normal breath sounds. No wheezing, rhonchi or rales.  Chest:     Chest wall: No tenderness.  Abdominal:     General: Abdomen is flat.     Palpations: Abdomen is soft.  Musculoskeletal:     Cervical back: Normal range of motion.  Lymphadenopathy:     Cervical: Cervical adenopathy present.  Skin:    General: Skin is warm and dry.     Capillary Refill: Capillary refill takes less than 2 seconds.  Neurological:     General: No focal deficit present.     Mental Status: She is alert and oriented to person, place, and time.  Psychiatric:        Behavior: Behavior normal.     Comments: anxious    ED Results / Procedures / Treatments   Labs (all labs ordered  are listed, but only abnormal results are displayed) Labs Reviewed  CBC - Abnormal; Notable for the following components:      Result Value   Hemoglobin 10.2 (*)    HCT 34.4 (*)    MCV 68.0 (*)    MCH 20.2 (*)    MCHC 29.7 (*)    RDW 20.8 (*)    All other components within normal limits  BASIC METABOLIC PANEL - Abnormal; Notable for the following components:   Glucose, Bld 118 (*)    Calcium 8.5 (*)    All other components within normal limits  RESPIRATORY PANEL BY RT PCR (FLU A&B, COVID)    EKG None  Radiology DG Chest Port 1 View  Result Date: 04/03/2020 CLINICAL DATA:  Productive cough EXAM: PORTABLE CHEST 1 VIEW COMPARISON:  September 04, 2018 FINDINGS: Lungs are clear. Heart size and pulmonary vascularity are normal. No adenopathy. No bone lesions. IMPRESSION: Lungs clear.  Cardiac silhouette normal. Electronically Signed   By: Bretta Bang III M.D.   On: 04/03/2020 09:30    Procedures Procedures (including critical care time)  Medications Ordered in ED Medications - No data to display  ED Course  I have reviewed the triage vital signs and the nursing notes.  Pertinent labs & imaging results that were available during my care of the patient were reviewed by me and considered in my medical decision making (see chart for details).   MDM Rules/Calculators/A&P                         Patient looks overall well and has reassuring physical exam,vitals, labs, and chest xray.  The discolored phlegm could be contributed by tooth decay. There does not appear to be sign of abscess or localized infection. Patient has normal vital signs.  Additionally, patient has anemia which appears to be chronic. Recommended she take iron supplements.   Final Clinical Impression(s) / ED Diagnoses Final diagnoses:  Microcytic anemia  Viral URI with cough   Rx / DC Orders ED Discharge Orders    None       Leeroy Bock, DO 04/03/20 1124    Joany Khatib L,  DO 04/03/20 1124    Gwyneth Sprout, MD 04/03/20 1353

## 2020-07-21 ENCOUNTER — Other Ambulatory Visit: Payer: Self-pay

## 2020-07-21 ENCOUNTER — Encounter (HOSPITAL_BASED_OUTPATIENT_CLINIC_OR_DEPARTMENT_OTHER): Payer: Self-pay | Admitting: *Deleted

## 2020-07-21 DIAGNOSIS — U071 COVID-19: Secondary | ICD-10-CM | POA: Insufficient documentation

## 2020-07-21 DIAGNOSIS — Z87891 Personal history of nicotine dependence: Secondary | ICD-10-CM | POA: Insufficient documentation

## 2020-07-21 DIAGNOSIS — R059 Cough, unspecified: Secondary | ICD-10-CM | POA: Diagnosis present

## 2020-07-21 LAB — URINALYSIS, MICROSCOPIC (REFLEX): Squamous Epithelial / HPF: >50 (ref 0–5)

## 2020-07-21 LAB — URINALYSIS, ROUTINE W REFLEX MICROSCOPIC
Bilirubin Urine: NEGATIVE
Glucose, UA: NEGATIVE mg/dL
Hgb urine dipstick: NEGATIVE
Ketones, ur: NEGATIVE mg/dL
Leukocytes,Ua: NEGATIVE
Nitrite: NEGATIVE
Protein, ur: 30 mg/dL — AB
Specific Gravity, Urine: 1.03 (ref 1.005–1.030)
pH: 6 (ref 5.0–8.0)

## 2020-07-21 LAB — PREGNANCY, URINE: Preg Test, Ur: NEGATIVE

## 2020-07-21 MED ORDER — ONDANSETRON 4 MG PO TBDP
4.0000 mg | ORAL_TABLET | Freq: Once | ORAL | Status: AC | PRN
  Administered 2020-07-21: 4 mg via ORAL
  Filled 2020-07-21: qty 1

## 2020-07-21 MED ORDER — ACETAMINOPHEN 325 MG PO TABS
650.0000 mg | ORAL_TABLET | Freq: Once | ORAL | Status: AC | PRN
  Administered 2020-07-21: 650 mg via ORAL
  Filled 2020-07-21: qty 2

## 2020-07-21 NOTE — ED Triage Notes (Signed)
N/V x 1 week.

## 2020-07-21 NOTE — ED Notes (Signed)
Pt requesting to see the doctor prior to having bloodwork drawn

## 2020-07-22 ENCOUNTER — Emergency Department (HOSPITAL_BASED_OUTPATIENT_CLINIC_OR_DEPARTMENT_OTHER)
Admission: EM | Admit: 2020-07-22 | Discharge: 2020-07-22 | Disposition: A | Discharge status: Home / Self Care | Payer: HRSA Program | Attending: Emergency Medicine | Admitting: Emergency Medicine

## 2020-07-22 DIAGNOSIS — Z20822 Contact with and (suspected) exposure to covid-19: Secondary | ICD-10-CM

## 2020-07-22 LAB — SARS CORONAVIRUS 2 (TAT 6-24 HRS): SARS Coronavirus 2: POSITIVE — AB

## 2020-07-22 MED ORDER — ONDANSETRON 4 MG PO TBDP: 8.0000 mg | ORAL_TABLET | Freq: Once | ORAL | Status: DC

## 2020-07-22 NOTE — ED Notes (Signed)
Pt refused to have blood work drawn. Provider made aware.

## 2020-07-22 NOTE — ED Provider Notes (Signed)
[facility] EMERGENCY DEPARTMENT Provider Note   CSN: [id number] Arrival date & time: 07/21/20  2210     History Chief Complaint  Patient presents with  . Vomiting    Sherry [name] is a 35 y.o. female.  The history is provided by the patient.  URI Presenting symptoms: congestion, cough, fatigue, fever, rhinorrhea and sore throat   Severity:  Moderate Onset quality:  Gradual Duration:  5 days Timing:  Constant Progression:  Unchanged Chronicity:  New Relieved by:  Nothing Worsened by:  Nothing Ineffective treatments:  None tried Associated symptoms: myalgias   Associated symptoms comment:  Today had emesis x1.   Risk factors: sick contacts        Past Medical History:  Diagnosis Date  . GERD (gastroesophageal reflux disease)     There are no problems to display for this patient.   History reviewed. No pertinent surgical history.   OB History   No obstetric history on file.     History reviewed. No pertinent family history.  Social History   Tobacco Use  . Smoking status: Former Smoker    Types: Cigarettes    Quit date: 12/11/2018    Years since quitting: 1.6  . Smokeless tobacco: Never Used  Vaping Use  . Vaping Use: Never used  Substance Use Topics  . Alcohol use: No  . Drug use: No    Home Medications Prior to Admission medications   Medication Sig Start Date End Date Taking? Authorizing Provider  clindamycin (CLEOCIN) 300 MG capsule Take 1 capsule (300 mg total) by mouth 4 (four) times daily. X 7 days 04/14/19   Molpus, John, MD  HYDROcodone-acetaminophen (NORCO) 5-325 MG tablet Take 1 tablet by mouth every 4 (four) hours as needed for severe pain. 04/14/19   Molpus, John, MD  metoCLOPramide (REGLAN) 10 MG tablet Take 1 tablet (10 mg total) by mouth every 6 (six) hours as needed for nausea or vomiting (nausea/headache). 03/13/19   Molpus, John, MD    Allergies    Patient has no known allergies.  Review of Systems   Review of  Systems  Constitutional: Positive for fatigue and fever.  HENT: Positive for congestion, rhinorrhea and sore throat.   Eyes: Negative for visual disturbance.  Respiratory: Positive for cough.   Gastrointestinal: Negative for abdominal pain, constipation and diarrhea.  Genitourinary: Negative for dysuria.  Musculoskeletal: Positive for myalgias.  Neurological: Negative for dizziness.  Psychiatric/Behavioral: Negative for agitation.  All other systems reviewed and are negative.   Physical Exam Updated Vital Signs BP (!) 120/92   Pulse 80   Temp 98.3 F (36.8 C) (Oral)   Resp 18   Ht 5\' 5"  (1.651 m)   Wt 86.2 kg   LMP 06/30/2020   SpO2 100%   BMI 31.62 kg/m   Physical Exam  ED Results / Procedures / Treatments   Labs (all labs ordered are listed, but only abnormal results are displayed) Results for orders placed or performed during the hospital encounter of 07/22/20  Urinalysis, Routine w reflex microscopic  Result Value Ref Range   Color, Urine YELLOW YELLOW   APPearance HAZY (A) CLEAR   Specific Gravity, Urine 1.030 1.005 - 1.030   pH 6.0 5.0 - 8.0   Glucose, UA NEGATIVE NEGATIVE mg/dL   Hgb urine dipstick NEGATIVE NEGATIVE   Bilirubin Urine NEGATIVE NEGATIVE   Ketones, ur NEGATIVE NEGATIVE mg/dL   Protein, ur 30 (A) NEGATIVE mg/dL   Nitrite NEGATIVE NEGATIVE   Leukocytes,Ua NEGATIVE  NEGATIVE  Pregnancy, urine  Result Value Ref Range   Preg Test, Ur NEGATIVE NEGATIVE  Urinalysis, Microscopic (reflex)  Result Value Ref Range   RBC / HPF 0-5 0 - 5 RBC/hpf   WBC, UA 6-10 0 - 5 WBC/hpf   Bacteria, UA FEW (A) NONE SEEN   Squamous Epithelial / LPF >50 0 - 5   No results found.  EKG None  Radiology No results found.  Procedures Procedures (including critical care time)  Medications Ordered in ED Medications  ondansetron (ZOFRAN-ODT) disintegrating tablet 8 mg (has no administration in time range)  ondansetron (ZOFRAN-ODT) disintegrating tablet 4 mg (4 mg  Oral Given 07/21/20 2226)  acetaminophen (TYLENOL) tablet 650 mg (650 mg Oral Given 07/21/20 2226)    ED Course  I have reviewed the triage vital signs and the nursing notes.  Pertinent labs & imaging results that were available during my care of the patient were reviewed by me and considered in my medical decision making (see chart for details).    MDM Rules/Calculators/A&P                          Seen and appreciate nurses note but vomiting x1 today only.  Partner with same symptoms, both unvaccinated.  Symptoms are consistent with covid.    Sherry [name] was evaluated in Emergency Department on 07/22/2020 for the symptoms described in the history of present illness. She was evaluated in the context of the global COVID-19 pandemic, which necessitated consideration that the patient might be at risk for infection with the SARS-CoV-2 virus that causes COVID-19. Institutional protocols and algorithms that pertain to the evaluation of patients at risk for COVID-19 are in a state of rapid change based on information released by regulatory bodies including the CDC and federal and state organizations. These policies and algorithms were followed during the patient's care in the ED.  Final Clinical Impression(s) / ED Diagnoses Return for intractable cough, coughing up blood,fevers >100.4 unrelieved by medication, shortness of breath, intractable vomiting, chest pain, shortness of breath, weakness,numbness, changes in speech, facial asymmetry,abdominal pain, passing out,Inability to tolerate liquids or food, cough, altered mental status or any concerns. No signs of systemic illness or infection. The patient is nontoxic-appearing on exam and vital signs are within normal limits.   I have reviewed the triage vital signs and the nursing notes. Pertinent labs &imaging results that were available during my care of the patient were reviewed by me and considered in my medical decision making (see chart  for details).After history, exam, and medical workup I feel the patient has beenappropriately medically screened and is safe for discharge home. Pertinent diagnoses were discussed with the patient. Patient was given return precautions.     Damiean Lukes, MD 07/22/20 [phone]

## 2020-07-22 NOTE — Discharge Instructions (Addendum)
Person Under Monitoring Name: Sherry [name]  Location: [address] [place] [address]   Infection Prevention Recommendations for Individuals Confirmed to have, or Being Evaluated for, 2019 Novel Coronavirus (COVID-19) Infection Who Receive Care at Home  Individuals who are confirmed to have, or are being evaluated for, COVID-19 should follow the prevention steps below until a healthcare provider or local or state health department says they can return to normal activities.  Stay home except to get medical care You should restrict activities outside your home, except for getting medical care. Do not go to work, school, or public areas, and do not use public transportation or taxis.  Call ahead before visiting your doctor Before your medical appointment, call the healthcare provider and tell them that you have, or are being evaluated for, COVID-19 infection. This will help the healthcare providers office take steps to keep other people from getting infected. Ask your healthcare provider to call the local or state health department.  Monitor your symptoms Seek prompt medical attention if your illness is worsening (e.g., difficulty breathing). Before going to your medical appointment, call the healthcare provider and tell them that you have, or are being evaluated for, COVID-19 infection. Ask your healthcare provider to call the local or state health department.  Wear a facemask You should wear a facemask that covers your nose and mouth when you are in the same room with other people and when you visit a healthcare provider. People who live with or visit you should also wear a facemask while they are in the same room with you.  Separate yourself from other people in your home As much as possible, you should stay in a different room from other people in your home. Also, you should use a separate bathroom, if available.  Avoid sharing household items You should  not share dishes, drinking glasses, cups, eating utensils, towels, bedding, or other items with other people in your home. After using these items, you should wash them thoroughly with soap and water.  Cover your coughs and sneezes Cover your mouth and nose with a tissue when you cough or sneeze, or you can cough or sneeze into your sleeve. Throw used tissues in a lined trash can, and immediately wash your hands with soap and water for at least 20 seconds or use an alcohol-based hand rub.  Wash your [employer] your hands often and thoroughly with soap and water for at least 20 seconds. You can use an alcohol-based hand sanitizer if soap and water are not available and if your hands are not visibly dirty. Avoid touching your eyes, nose, and mouth with unwashed hands.   Prevention Steps for Caregivers and Household Members of Individuals Confirmed to have, or Being Evaluated for, COVID-19 Infection Being Cared for in the Home  If you live with, or provide care at home for, a person confirmed to have, or being evaluated for, COVID-19 infection please follow these guidelines to prevent infection:  Follow healthcare providers instructions Make sure that you understand and can help the patient follow any healthcare provider instructions for all care.  Provide for the patients basic needs You should help the patient with basic needs in the home and provide support for getting groceries, prescriptions, and other personal needs.  Monitor the patients symptoms If they are getting sicker, call his or her medical provider and tell them that the patient has, or is being evaluated for, COVID-19 infection. This will help the healthcare  providers office take steps to keep other people from getting infected. Ask the healthcare provider to call the local or state health department.  Limit the number of people who have contact with the patient If possible, have only one caregiver for the  patient. Other household members should stay in another home or place of residence. If this is not possible, they should stay in another room, or be separated from the patient as much as possible. Use a separate bathroom, if available. Restrict visitors who do not have an essential need to be in the home.  Keep older adults, very young children, and other sick people away from the patient Keep older adults, very young children, and those who have compromised immune systems or chronic health conditions away from the patient. This includes people with chronic heart, lung, or kidney conditions, diabetes, and cancer.  Ensure good ventilation Make sure that shared spaces in the home have good air flow, such as from an air conditioner or an opened window, weather permitting.  Wash your hands often Wash your hands often and thoroughly with soap and water for at least 20 seconds. You can use an alcohol based hand sanitizer if soap and water are not available and if your hands are not visibly dirty. Avoid touching your eyes, nose, and mouth with unwashed hands. Use disposable paper towels to dry your hands. If not available, use dedicated cloth towels and replace them when they become wet.  Wear a facemask and gloves Wear a disposable facemask at all times in the room and gloves when you touch or have contact with the patients blood, body fluids, and/or secretions or excretions, such as sweat, saliva, sputum, nasal mucus, vomit, urine, or feces.  Ensure the mask fits over your nose and mouth tightly, and do not touch it during use. Throw out disposable facemasks and gloves after using them. Do not reuse. Wash your hands immediately after removing your facemask and gloves. If your personal clothing becomes contaminated, carefully remove clothing and launder. Wash your hands after handling contaminated clothing. Place all used disposable facemasks, gloves, and other waste in a lined container before  disposing them with other household waste. Remove gloves and wash your hands immediately after handling these items.  Do not share dishes, glasses, or other household items with the patient Avoid sharing household items. You should not share dishes, drinking glasses, cups, eating utensils, towels, bedding, or other items with a patient who is confirmed to have, or being evaluated for, COVID-19 infection. After the person uses these items, you should wash them thoroughly with soap and water.  Wash laundry thoroughly Immediately remove and wash clothes or bedding that have blood, body fluids, and/or secretions or excretions, such as sweat, saliva, sputum, nasal mucus, vomit, urine, or feces, on them. Wear gloves when handling laundry from the patient. Read and follow directions on labels of laundry or clothing items and detergent. In general, wash and dry with the warmest temperatures recommended on the label.  Clean all areas the individual has used often Clean all touchable surfaces, such as counters, tabletops, doorknobs, bathroom fixtures, toilets, phones, keyboards, tablets, and bedside tables, every day. Also, clean any surfaces that may have blood, body fluids, and/or secretions or excretions on them. Wear gloves when cleaning surfaces the patient has come in contact with. Use a diluted bleach solution (e.g., dilute bleach with 1 part bleach and 10 parts water) or a household disinfectant with a label that says EPA-registered for coronaviruses. To make  a bleach solution at home, add 1 tablespoon of bleach to 1 quart (4 cups) of water. For a larger supply, add  cup of bleach to 1 gallon (16 cups) of water. Read labels of cleaning products and follow recommendations provided on product labels. Labels contain instructions for safe and effective use of the cleaning product including precautions you should take when applying the product, such as wearing gloves or eye protection and making sure you  have good ventilation during use of the product. Remove gloves and wash hands immediately after cleaning.  Monitor yourself for signs and symptoms of illness Caregivers and household members are considered close contacts, should monitor their health, and will be asked to limit movement outside of the home to the extent possible. Follow the monitoring steps for close contacts listed on the symptom monitoring form.   ? If you have additional questions, contact your local health department or call the epidemiologist on call at [phone] (available 24/7). ? This guidance is subject to change. For the most up-to-date guidance from [facility], please refer to their website: [email or web address]

## 2020-07-28 ENCOUNTER — Telehealth: Payer: Self-pay | Admitting: Family

## 2020-07-28 DIAGNOSIS — U071 COVID-19: Secondary | ICD-10-CM

## 2020-07-28 NOTE — Progress Notes (Signed)
Based on what you shared with me, I feel your condition warrants further evaluation and I recommend that you be seen for a face to face office visit.   Given your symptoms, you need to be seen face to face for further evaluation.    NOTE: If you entered your credit card information for this eVisit, you will not be charged. You may see a "hold" on your card for the $35 but that hold will drop off and you will not have a charge processed.   If you are having a true medical emergency please call 911.      For an urgent face to face visit, [facility] has five urgent care centers for your convenience:     [facility] at [facility] Directions [phone] [address] [place], [place] [address] . 10 am - 6pm Monday - Friday    [facility]) Get Driving Directions [phone] [address] [place], [place] [address] . 10 am to 8 pm Monday-Friday . 12 pm to 8 pm [facility] Care at [facility] Get Driving Directions [phone] [address] [place], [place] [address] . 8 am to 8 pm Monday-Friday . 9 am to 6 pm Saturday . 11 am to 6 pm Sunday     [facility] at [facility] Get Driving Directions  [phone] [address] [place], [place] [address] . 8 am to 8 pm Monday-Friday . 8 am to 4 pm [facility] at [facility] Directions [phone] [address] [place], [place] [address] . 12 pm to 6 pm Monday-Friday      Your e-visit answers were reviewed by a board certified advanced clinical practitioner to complete your personal care plan.  Thank you for using e-Visits.

## 2020-09-01 IMAGING — DX DG ABDOMEN 1V
1 series · 1 of 1 positions shown · non-contrast
Comparison: None.

CLINICAL DATA: Abdominal pain with low back pain for 3 days.
Initial encounter.

EXAM:
ABDOMEN - 1 VIEW

[abdomen kub]
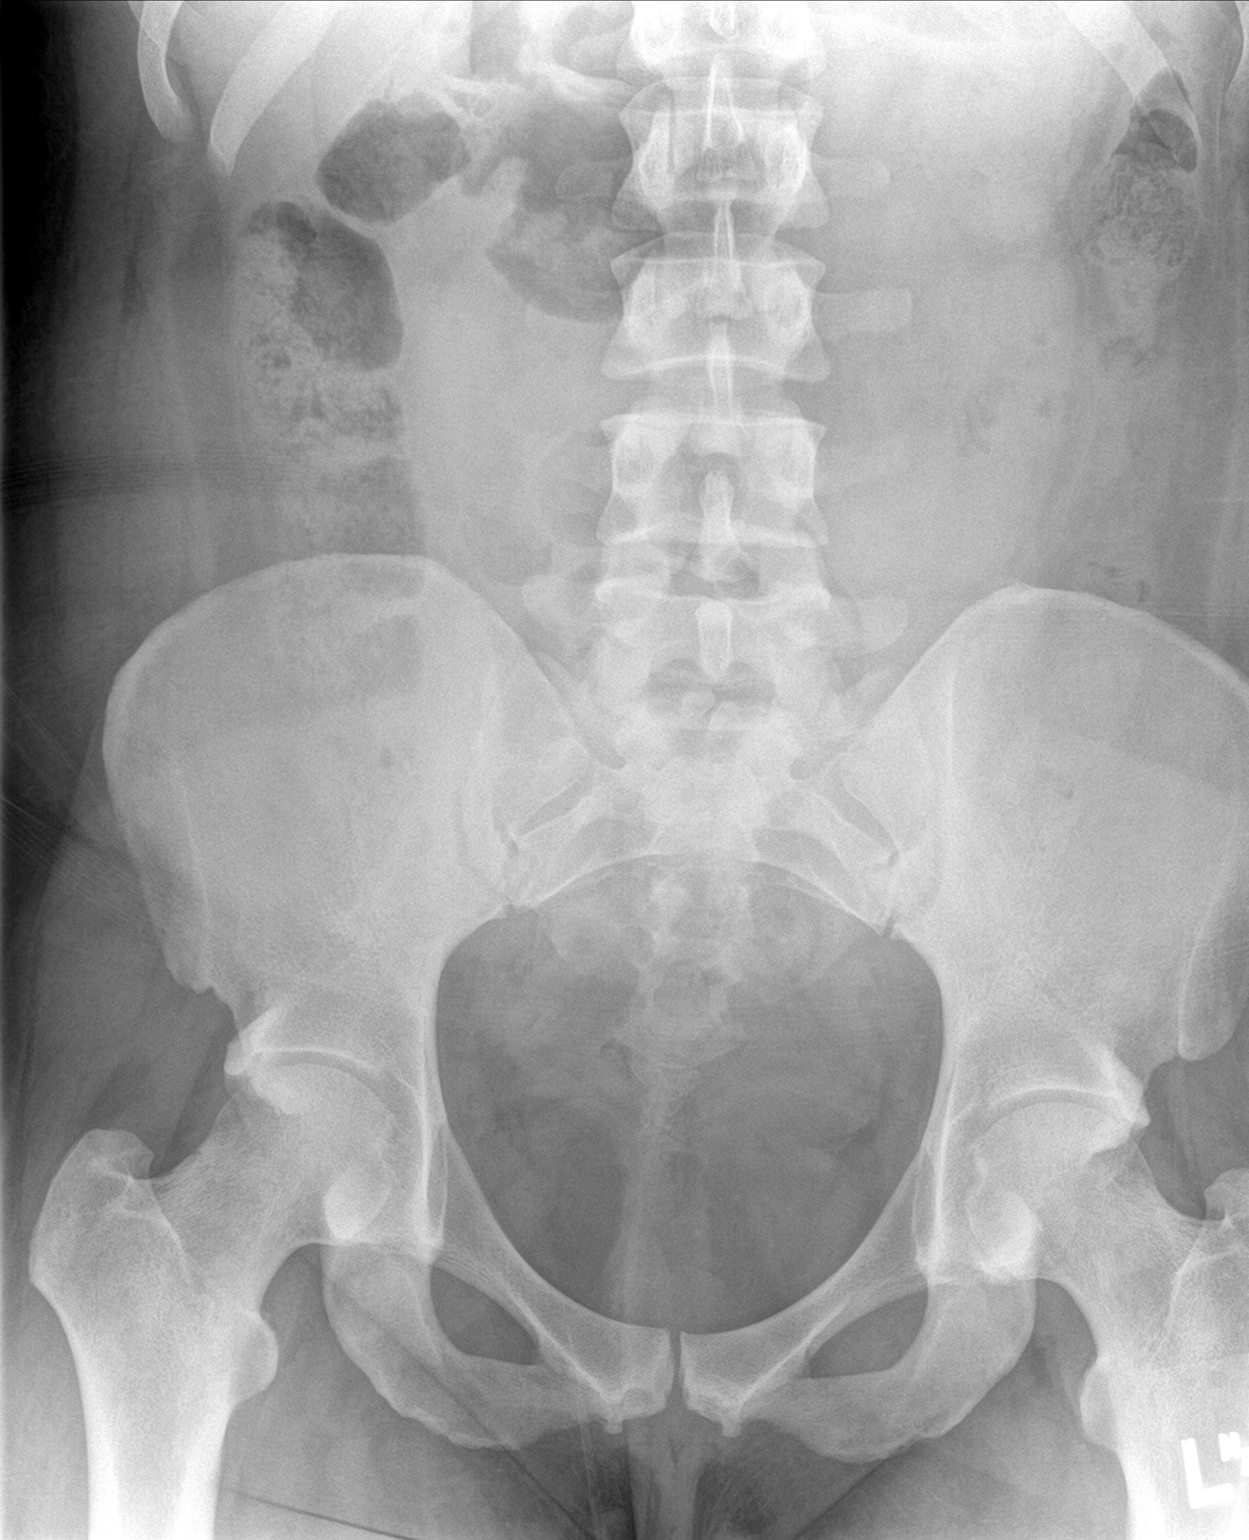

[1 of 1 positions shown; findings below may reference images not displayed]

FINDINGS: The bowel gas pattern is normal. No radio-opaque calculi or other
significant radiographic abnormality are seen.
IMPRESSION: Negative one view abdomen.

## 2021-03-02 ENCOUNTER — Other Ambulatory Visit: Payer: Self-pay

## 2021-03-02 ENCOUNTER — Emergency Department (HOSPITAL_COMMUNITY)
Admission: EM | Admit: 2021-03-02 | Discharge: 2021-03-02 | Disposition: A | Discharge status: Home / Self Care | Payer: Self-pay | Attending: Emergency Medicine | Admitting: Emergency Medicine

## 2021-03-02 ENCOUNTER — Encounter (HOSPITAL_COMMUNITY): Payer: Self-pay | Admitting: *Deleted

## 2021-03-02 DIAGNOSIS — H6690 Otitis media, unspecified, unspecified ear: Secondary | ICD-10-CM

## 2021-03-02 DIAGNOSIS — Z87891 Personal history of nicotine dependence: Secondary | ICD-10-CM | POA: Insufficient documentation

## 2021-03-02 DIAGNOSIS — H6692 Otitis media, unspecified, left ear: Secondary | ICD-10-CM | POA: Insufficient documentation

## 2021-03-02 MED ORDER — NAPROXEN 250 MG PO TABS: 500.0000 mg | ORAL_TABLET | Freq: Once | ORAL | Status: DC

## 2021-03-02 MED ORDER — NAPROXEN 500 MG PO TABS: 500.0000 mg | ORAL_TABLET | Freq: Two times a day (BID) | ORAL | 0 refills | Status: DC

## 2021-03-02 MED ORDER — AMOXICILLIN 500 MG PO CAPS: 500.0000 mg | ORAL_CAPSULE | Freq: Three times a day (TID) | ORAL | 0 refills | Status: AC

## 2021-03-02 MED ORDER — AMOXICILLIN 500 MG PO CAPS: 500.0000 mg | ORAL_CAPSULE | Freq: Once | ORAL | Status: DC

## 2021-03-02 NOTE — Discharge Instructions (Addendum)
You were evaluated in the Emergency Department and after careful evaluation, we did not find any emergent condition requiring admission or further testing in the hospital.  Your exam/testing today was overall reassuring.  Symptoms seem to be due to an ear infection.  Please take the amoxicillin antibiotic as directed.  Use the Naprosyn anti-inflammatory for pain.  Please return to the Emergency Department if you experience any worsening of your condition.  Thank you for allowing [place] to be a part of your care.

## 2021-03-02 NOTE — ED Provider Notes (Signed)
[facility] Emergency Department Provider Note MRN:  [id number]  Arrival date & time: 03/02/21     Chief Complaint   Ear Fullness   History of Present Illness   Sherry [name] is a 35 y.o. year-old female with no pertinent past medical history presenting to the ED with chief complaint of ear fullness.  Pain and fullness to the left ear for the past 1 to 2 days.  Denies any fever, no cough, no other complaints.  Pain seems to be worsening.  Feels like her hearing is muffled in this ear.  Symptoms are mild to moderate, constant.  Review of Systems  A problem-focused ROS was performed. Positive for ear pain.  Patient denies fever.  Patient's Health History    Past Medical History:  Diagnosis Date   GERD (gastroesophageal reflux disease)     No past surgical history on file.  No family history on file.  Social History   Socioeconomic History   Marital status: Single    Spouse name: Not on file   Number of children: Not on file   Years of education: Not on file   Highest education level: Not on file  Occupational History   Not on file  Tobacco Use   Smoking status: Former    Types: Cigarettes    Quit date: 12/11/2018    Years since quitting: 2.2   Smokeless tobacco: Never  Vaping Use   Vaping Use: Never used  Substance and Sexual Activity   Alcohol use: No   Drug use: No   Sexual activity: Yes    Birth control/protection: None  Other Topics Concern   Not on file  Social History Narrative   Not on file   Social Determinants of Health   Financial Resource Strain: Not on file  Food Insecurity: Not on file  Transportation Needs: Not on file  Physical Activity: Not on file  Stress: Not on file  Social Connections: Not on file  Intimate Partner Violence: Not on file     Physical Exam   Vitals:   03/02/21 0631  BP: (!) 160/108  Pulse: 94  Resp: 17  Temp: 98.6 F (37 C)  SpO2: 99%    CONSTITUTI regular ONAL: Well-appearing,  NAD NEURO:  Alert and oriented x 3, no focal deficits EYES:  eyes equal and reactive ENT/NECK:  no LAD, no JVD; erythema to left TM CARDIO: Regular rate, well-perfused, normal S1 and S2 PULM:  CTAB no wheezing or rhonchi GI/GU:  normal bowel sounds, non-distended, non-tender MSK/SPINE:  No gross deformities, no edema SKIN:  no rash, atraumatic PSYCH:  Appropriate speech and behavior  *Additional and/or pertinent findings included in MDM below  Diagnostic and Interventional Summary    EKG Interpretation  Date/Time:    Ventricular Rate:    PR Interval:    QRS Duration:   QT Interval:    QTC Calculation:   R Axis:     Text Interpretation:         Labs Reviewed - No data to display  No orders to display    Medications  amoxicillin (AMOXIL) capsule 500 mg (has no administration in time range)  naproxen (NAPROSYN) tablet 500 mg (has no administration in time range)     Procedures  /  Critical Care Procedures  ED Course and Medical Decision Making  I have reviewed the triage vital signs, the nursing notes, and pertinent available records from the EMR.  Listed above are laboratory and imaging tests that  I personally ordered, reviewed, and interpreted and then considered in my medical decision making (see below for details).  The appearance of the TM seems consistent with otitis media.  She also had some mild pain with movement of the pinna but no tenderness to the mastoid, no systemic symptoms, appropriate for discharge on antibiotics and pain control.       Elmer Sow. Pilar Plate, MD [facility] [address] [facility] [email or web address]  Final Clinical Impressions(s) / ED Diagnoses     ICD-10-CM   1. Acute otitis media, unspecified otitis media type  H66.90       ED Discharge Orders          Ordered    naproxen (NAPROSYN) 500 MG tablet  2 times daily        03/02/21 0645    amoxicillin (AMOXIL) 500 MG capsule  3 times daily        03/02/21  0645             Discharge Instructions Discussed with and Provided to Patient:    Discharge Instructions      You were evaluated in the Emergency Department and after careful evaluation, we did not find any emergent condition requiring admission or further testing in the hospital.  Your exam/testing today was overall reassuring.  Symptoms seem to be due to an ear infection.  Please take the amoxicillin antibiotic as directed.  Use the Naprosyn anti-inflammatory for pain.  Please return to the Emergency Department if you experience any worsening of your condition.  Thank you for allowing [place] to be a part of your care.        Sabas Sous, MD 03/02/21 [phone]

## 2021-03-02 NOTE — ED Triage Notes (Signed)
Patient presents to ed c/o left earache onset yest  states last pm pain was worse and she felt like a static sound in her ear. Staes her ear now feels stopped up

## 2021-05-06 ENCOUNTER — Ambulatory Visit (INDEPENDENT_AMBULATORY_CARE_PROVIDER_SITE_OTHER): Payer: Self-pay

## 2021-05-06 ENCOUNTER — Telehealth: Payer: Self-pay | Admitting: Physician Assistant

## 2021-05-06 ENCOUNTER — Encounter (HOSPITAL_COMMUNITY): Payer: Self-pay

## 2021-05-06 ENCOUNTER — Emergency Department (HOSPITAL_BASED_OUTPATIENT_CLINIC_OR_DEPARTMENT_OTHER): Payer: Self-pay

## 2021-05-06 ENCOUNTER — Other Ambulatory Visit: Payer: Self-pay

## 2021-05-06 ENCOUNTER — Emergency Department (HOSPITAL_BASED_OUTPATIENT_CLINIC_OR_DEPARTMENT_OTHER)
Admission: EM | Admit: 2021-05-06 | Discharge: 2021-05-06 | Disposition: A | Discharge status: Home / Self Care | Payer: Self-pay | Attending: Emergency Medicine | Admitting: Emergency Medicine

## 2021-05-06 ENCOUNTER — Ambulatory Visit (HOSPITAL_COMMUNITY)
Admission: EM | Admit: 2021-05-06 | Discharge: 2021-05-06 | Payer: Self-pay | Attending: Emergency Medicine | Admitting: Emergency Medicine

## 2021-05-06 ENCOUNTER — Ambulatory Visit (HOSPITAL_COMMUNITY): Payer: Self-pay

## 2021-05-06 DIAGNOSIS — W19XXXA Unspecified fall, initial encounter: Secondary | ICD-10-CM

## 2021-05-06 DIAGNOSIS — Z87891 Personal history of nicotine dependence: Secondary | ICD-10-CM | POA: Insufficient documentation

## 2021-05-06 DIAGNOSIS — S8991XA Unspecified injury of right lower leg, initial encounter: Secondary | ICD-10-CM | POA: Insufficient documentation

## 2021-05-06 DIAGNOSIS — X509XXA Other and unspecified overexertion or strenuous movements or postures, initial encounter: Secondary | ICD-10-CM | POA: Insufficient documentation

## 2021-05-06 DIAGNOSIS — M25561 Pain in right knee: Secondary | ICD-10-CM

## 2021-05-06 DIAGNOSIS — M79604 Pain in right leg: Secondary | ICD-10-CM

## 2021-05-06 DIAGNOSIS — T1490XA Injury, unspecified, initial encounter: Secondary | ICD-10-CM

## 2021-05-06 DIAGNOSIS — Y9302 Activity, running: Secondary | ICD-10-CM | POA: Insufficient documentation

## 2021-05-06 MED ORDER — KETOROLAC TROMETHAMINE 60 MG/2ML IM SOLN
30.0000 mg | Freq: Once | INTRAMUSCULAR | Status: DC
  Filled 2021-05-06: qty 2

## 2021-05-06 MED ORDER — IBUPROFEN 800 MG PO TABS
800.0000 mg | ORAL_TABLET | Freq: Once | ORAL | Status: AC
  Administered 2021-05-06: 800 mg via ORAL
  Filled 2021-05-06: qty 1

## 2021-05-06 NOTE — ED Provider Notes (Signed)
[facility] EMERGENCY DEPT Provider Note   CSN: [id number] Arrival date & time: 05/06/21  1837     History Chief Complaint  Patient presents with   Knee Pain    Sherry [name] is a 35 y.o. female.   Knee Pain Associated symptoms: no back pain, no fatigue and no fever   Patient presents for injury to her right knee.  Injury occurred yesterday while she was running.  She had a twisting motion and fell to the ground.  She denies feeling any pops or cracks.  She has not been able to bear weight on her right leg since this accident.  She has had continued pain and has developed swelling in the area of her right knee.  She was seen in urgent care prior to arrival.  While there, x-ray of right lower leg was obtained.  X-ray imaging showed small osseous fragment on the lateral side of knee with suspected donor site from the lateral femoral condyle.  She presents to the ED for further evaluation.    Past Medical History:  Diagnosis Date   GERD (gastroesophageal reflux disease)     There are no problems to display for this patient.   No past surgical history on file.   OB History   No obstetric history on file.     No family history on file.  Social History   Tobacco Use   Smoking status: Former    Types: Cigarettes    Quit date: 12/11/2018    Years since quitting: 2.4   Smokeless tobacco: Never  Vaping Use   Vaping Use: Never used  Substance Use Topics   Alcohol use: No   Drug use: No    Home Medications Prior to Admission medications   Medication Sig Start Date End Date Taking? Authorizing Provider  clindamycin (CLEOCIN) 300 MG capsule Take 1 capsule (300 mg total) by mouth 4 (four) times daily. X 7 days 04/14/19   Molpus, John, MD  HYDROcodone-acetaminophen (NORCO) 5-325 MG tablet Take 1 tablet by mouth every 4 (four) hours as needed for severe pain. 04/14/19   Molpus, John, MD  metoCLOPramide (REGLAN) 10 MG tablet Take 1 tablet (10 mg total) by mouth  every 6 (six) hours as needed for nausea or vomiting (nausea/headache). 03/13/19   Molpus, John, MD  naproxen (NAPROSYN) 500 MG tablet Take 1 tablet (500 mg total) by mouth 2 (two) times daily. 03/02/21   Sabas Sous, MD    Allergies    Fish allergy and Shellfish allergy  Review of Systems   Review of Systems  Constitutional:  Negative for chills, fatigue and fever.  HENT:  Negative for ear pain and sore throat.   Eyes:  Negative for pain and visual disturbance.  Respiratory:  Negative for cough, chest tightness and shortness of breath.   Cardiovascular:  Negative for chest pain and palpitations.  Gastrointestinal:  Negative for abdominal pain, nausea and vomiting.  Genitourinary:  Negative for dysuria, flank pain and hematuria.  Musculoskeletal:  Positive for arthralgias, gait problem and joint swelling. Negative for back pain and neck stiffness.  Skin:  Negative for color change and rash.  Neurological:  Negative for dizziness, seizures, syncope, weakness, light-headedness, numbness and headaches.  Hematological:  Does not bruise/bleed easily.  All other systems reviewed and are negative.  Physical Exam Updated Vital Signs BP (!) 138/95   Pulse 83   Temp 98.8 F (37.1 C)   Resp 18   Ht 5\' 5"  (1.651 m)  Wt 90.7 kg   LMP 04/24/2021   SpO2 98%   BMI 33.28 kg/m   Physical Exam Vitals and nursing note reviewed.  Constitutional:      General: She is not in acute distress.    Appearance: Normal appearance. She is well-developed. She is not ill-appearing, toxic-appearing or diaphoretic.  HENT:     Head: Normocephalic and atraumatic.     Right Ear: External ear normal.     Left Ear: External ear normal.     Nose: Nose normal.  Eyes:     Extraocular Movements: Extraocular movements intact.     Conjunctiva/sclera: Conjunctivae normal.  Cardiovascular:     Rate and Rhythm: Normal rate and regular rhythm.     Heart sounds: No murmur heard. Pulmonary:     Effort: Pulmonary  effort is normal. No respiratory distress.     Breath sounds: Normal breath sounds.  Chest:     Chest wall: No tenderness.  Abdominal:     Palpations: Abdomen is soft.     Tenderness: There is no abdominal tenderness.  Musculoskeletal:        General: Swelling, tenderness and signs of injury present.     Cervical back: Normal range of motion and neck supple.  Skin:    General: Skin is warm and dry.     Capillary Refill: Capillary refill takes less than 2 seconds.     Coloration: Skin is not jaundiced or pale.  Neurological:     General: No focal deficit present.     Mental Status: She is alert and oriented to person, place, and time.     Cranial Nerves: No cranial nerve deficit.     Sensory: No sensory deficit.     Motor: No weakness.  Psychiatric:        Mood and Affect: Mood normal.        Behavior: Behavior normal.        Thought Content: Thought content normal.        Judgment: Judgment normal.    ED Results / Procedures / Treatments   Labs (all labs ordered are listed, but only abnormal results are displayed) Labs Reviewed - No data to display  EKG None  Radiology DG Tibia/Fibula Right  Result Date: 05/06/2021 CLINICAL DATA:  Fall with calf pain EXAM: RIGHT TIBIA AND FIBULA - 2 VIEW COMPARISON:  None. FINDINGS: Considerable edema lateral side of the knee and proximal leg. Small osseous fragment lateral to the joint space, probable donor site from the lateral femoral condyle. IMPRESSION: 1. Small osseous fragment on the lateral side of the knee with suspected donor site from the lateral femoral condyle, edema and soft tissue thickening lateral side of the knee, findings suspicious for avulsion injury involving the lateral collateral ligamentous complex. Suggest correlation with MRI. Electronically Signed   By: Jasmine Pang M.D.   On: 05/06/2021 16:27   DG Knee Complete 4 Views Right  Result Date: 05/06/2021 CLINICAL DATA:  Knee pain EXAM: RIGHT KNEE - COMPLETE 4+ VIEW  COMPARISON:  03/06/2021 FINDINGS: No malalignment. No sizable knee effusion. Joint spaces are patent. Considerable edema on the lateral side of the knee and lower leg. 3 mm suspected avulsion fragment along the lateral aspect of joint space, probably from lateral femoral condyle. IMPRESSION: Suspected avulsion fracture fragment at the lateral aspect of the joint space, raising concern for collateral ligamentous injury. MRI was previously recommended for further evaluation Electronically Signed   By: Jasmine Pang M.D.   On:  05/06/2021 22:30    Procedures Procedures   Medications Ordered in ED Medications  ibuprofen (ADVIL) tablet 800 mg (800 mg Oral Given 05/06/21 2300)    ED Course  I have reviewed the triage vital signs and the nursing notes.  Pertinent labs & imaging results that were available during my care of the patient were reviewed by me and considered in my medical decision making (see chart for details).    MDM Rules/Calculators/A&P                          Patient presents to the ED for knee injury that she sustained yesterday.  Prior to arrival, she was seen at urgent care where x-ray imaging was obtained.  X-ray showed a small osseous fragment on the lateral side of the knee that is suspected to have come from lateral femoral condyle.  There is also lateral edema suggestive of LCL injury.  She was advised to get an MRI.  She came to the ED for further evaluation.  Prior to being bedded in the ED, an x-ray of her knee was obtained which showed redemonstration of earlier findings.  On assessment, patient is resting comfortably.  She states that her pain is well controlled when resting with her knee in slight flexion.  Patient does have diffuse swelling and tenderness to the area of her knee.  Range of motion is limited by pain.  Patient was placed in knee immobilizer and provided crutches.  She was instructed that she will need orthopedic surgery follow-up and likely outpatient MRI for  further evaluation of injury and further management.  She was provided with contact information for on-call orthopedic surgeon.  She was advised to schedule a follow-up appointment soon as possible.  Patient was offered narcotic pain medication but declined.  She stated that she would treat her pain with ibuprofen.  At work, she is frequently on her feet.  Work note was provided.  Patient was discharged in stable condition.  Final Clinical Impression(s) / ED Diagnoses Final diagnoses:  Injury of right knee, initial encounter    Rx / DC Orders ED Discharge Orders     None        Gloris Manchester, MD 05/07/21 [phone]

## 2021-05-06 NOTE — Discharge Instructions (Addendum)
Recommend to have a ct scan/ mri  to rule blood clot or possible tendon tear. Explained to pt that it could possibly be edema causing it but we are not able to rule this out.  Pt agree with treatment plan friend will drive her

## 2021-05-06 NOTE — ED Notes (Signed)
Patient is being discharged from the Urgent Care and sent to the Emergency Department via personal vehicle . Per Provider Maple Mirza, patient is in need of higher level of care due to rule out tendon or muscle injury. Patient is aware and verbalizes understanding of plan of care.  Vitals:   05/06/21 1518  BP: 136/89  Pulse: 89  Resp: 18  Temp: 98.9 F (37.2 C)  SpO2: 98%

## 2021-05-06 NOTE — ED Triage Notes (Signed)
Patient reports yesterday she fell running to her house and hurt her right knee. Patient reports she was sent from UC to get a CT scan of leg

## 2021-05-06 NOTE — Progress Notes (Signed)
Virtual Visit Consent   Sherry [name], you are scheduled for a virtual visit with a [facility] provider today.     Just as with appointments in the office, your consent must be obtained to participate.  Your consent will be active for this visit and any virtual visit you may have with one of our providers in the next 365 days.     If you have a MyChart account, a copy of this consent can be sent to you electronically.  All virtual visits are billed to your insurance company just like a traditional visit in the office.    As this is a virtual visit, video technology does not allow for your provider to perform a traditional examination.  This may limit your provider's ability to fully assess your condition.  If your provider identifies any concerns that need to be evaluated in person or the need to arrange testing (such as labs, EKG, etc.), we will make arrangements to do so.     Although advances in technology are sophisticated, we cannot ensure that it will always work on either your end or our end.  If the connection with a video visit is poor, the visit may have to be switched to a telephone visit.  With either a video or telephone visit, we are not always able to ensure that we have a secure connection.     I need to obtain your verbal consent now.   Are you willing to proceed with your visit today?    Sherry [name] has provided verbal consent on 05/06/2021 for a virtual visit (video or telephone).   Sherry [name], [name]   Date: 05/06/2021 11:30 AM   Virtual Visit via Video Note   I, Sherry [name], connected with  Sherry [name]  ([id number], 1985/12/27) on 05/06/21 at 11:15 AM EDT by a video-enabled telemedicine application and verified that I am speaking with the correct person using two identifiers.  Location: Patient: Virtual Visit Location Patient: Home Provider: Virtual Visit Location Provider: Home Office   I discussed the limitations of evaluation and management  by telemedicine and the availability of in person appointments. The patient expressed understanding and agreed to proceed.    History of Present Illness: Sherry [name] is a 35 y.o. who identifies as a female who was assigned female at birth, and is being seen today for possible broken leg. Notes last night she was running and her R leg slipped out from under her with leg going forward and she fell backward. Was unabel to get up and walk so EMS was called. They assessed her and she notes she was told they did not think it was broken. She notes leg is swollen from calf up with substantial pain. Denies ability to bear weight on affected leg. Denies numbness/tingling in leg or foot.   HPI: HPI  Problems: There are no problems to display for this patient.   Allergies: No Known Allergies Medications:  Current Outpatient Medications:    clindamycin (CLEOCIN) 300 MG capsule, Take 1 capsule (300 mg total) by mouth 4 (four) times daily. X 7 days, Disp: 28 capsule, Rfl: 0   HYDROcodone-acetaminophen (NORCO) 5-325 MG tablet, Take 1 tablet by mouth every 4 (four) hours as needed for severe pain., Disp: 20 tablet, Rfl: 0   metoCLOPramide (REGLAN) 10 MG tablet, Take 1 tablet (10 mg total) by mouth every 6 (six) hours as needed for nausea or vomiting (nausea/headache)., Disp: 8 tablet, Rfl: 0   naproxen (NAPROSYN)  500 MG tablet, Take 1 tablet (500 mg total) by mouth 2 (two) times daily., Disp: 30 tablet, Rfl: 0  Observations/Objective: Patient is well-developed, well-nourished in no acute distress.  Resting comfortably at home.  Head is normocephalic, atraumatic.  No labored breathing. Speech is clear and coherent with logical content.  Unable to examine leg giving her video connection did not work..  Assessment and Plan: 1. Injury of right lower extremity, initial encounter Concern for fracture giving inability to weight bare. Also muscle strain versus tear. She is to have someone take her for evaluation  at Urgent Care ASAP so she can get examination and initial plain films. Also gave her info for the Orthopedic After hours urgent care.   No charge since sending for in-person evaluation.   Follow Up Instructions: I discussed the assessment and treatment plan with the patient. The patient was provided an opportunity to ask questions and all were answered. The patient agreed with the plan and demonstrated an understanding of the instructions.  A copy of instructions were sent to the patient via MyChart unless otherwise noted below.   The patient was advised to call back or seek an in-person evaluation if the symptoms worsen or if the condition fails to improve as anticipated.  Time:  I spent 16 minutes with the patient via telehealth technology discussing the above problems/concerns.    Sherry Climes, PA-C

## 2021-05-06 NOTE — ED Provider Notes (Signed)
[facility]    CSN: [id number] Arrival date & time: 05/06/21  1309      History   Chief Complaint Chief Complaint  Patient presents with   Fall   Leg Pain    HPI Sherry [name] is a 35 y.o. female.   HPI  Past Medical History:  Diagnosis Date   GERD (gastroesophageal reflux disease)     There are no problems to display for this patient.   History reviewed. No pertinent surgical history.  OB History   No obstetric history on file.      Home Medications    Prior to Admission medications   Medication Sig Start Date End Date Taking? Authorizing Provider  clindamycin (CLEOCIN) 300 MG capsule Take 1 capsule (300 mg total) by mouth 4 (four) times daily. X 7 days 04/14/19   Molpus, John, MD  HYDROcodone-acetaminophen (NORCO) 5-325 MG tablet Take 1 tablet by mouth every 4 (four) hours as needed for severe pain. 04/14/19   Molpus, John, MD  metoCLOPramide (REGLAN) 10 MG tablet Take 1 tablet (10 mg total) by mouth every 6 (six) hours as needed for nausea or vomiting (nausea/headache). 03/13/19   Molpus, John, MD  naproxen (NAPROSYN) 500 MG tablet Take 1 tablet (500 mg total) by mouth 2 (two) times daily. 03/02/21   Sabas Sous, MD    Family History History reviewed. No pertinent family history.  Social History Social History   Tobacco Use   Smoking status: Former    Types: Cigarettes    Quit date: 12/11/2018    Years since quitting: 2.4   Smokeless tobacco: Never  Vaping Use   Vaping Use: Never used  Substance Use Topics   Alcohol use: No   Drug use: No     Allergies   Fish allergy and Shellfish allergy   Review of Systems Review of Systems   Physical Exam Triage Vital Signs ED Triage Vitals  Enc Vitals Group     BP 05/06/21 1518 136/89     Pulse Rate 05/06/21 1518 89     Resp 05/06/21 1518 18     Temp 05/06/21 1518 98.9 F (37.2 C)     Temp Source 05/06/21 1518 Oral     SpO2 05/06/21 1518 98 %     Weight --      Height --       Head Circumference --      Peak Flow --      Pain Score 05/06/21 1522 7     Pain Loc --      Pain Edu? --      Excl. in GC? --    No data found.  Updated Vital Signs BP 136/89 (BP Location: Left Arm)   Pulse 89   Temp 98.9 F (37.2 C) (Oral)   Resp 18   LMP 04/24/2021   SpO2 98%   Visual Acuity Right Eye Distance:   Left Eye Distance:   Bilateral Distance:    Right Eye Near:   Left Eye Near:    Bilateral Near:     Physical Exam   UC Treatments / Results  Labs (all labs ordered are listed, but only abnormal results are displayed) Labs Reviewed - No data to display  EKG   Radiology No results found.  Procedures Procedures (including critical care time)  Medications Ordered in UC Medications - No data to display  Initial Impression / Assessment and Plan / UC Course  I have reviewed the triage vital  signs and the nursing notes.  Pertinent labs & imaging results that were available during my care of the patient were reviewed by me and considered in my medical decision making (see chart for details).     Pt not able to tolerate knee x ray not able to bend or flex or bear pt  Pt will need to have a ct scan done to rule out injury    Final Clinical Impressions(s) / UC Diagnoses   Final diagnoses:  Injury   Discharge Instructions   None    ED Prescriptions   None    PDMP not reviewed this encounter.   Coralyn Mark, NP 05/14/21 1338

## 2021-05-06 NOTE — Patient Instructions (Addendum)
  Kymberli Mall, thank you for joining [name], PA-C for today's virtual visit.  While this provider is not your primary care provider (PCP), if your PCP is located in our provider database this encounter information will be shared with them immediately following your visit.  Consent: (Patient) Sherry [name] provided verbal consent for this virtual visit at the beginning of the encounter.  Current Medications:  Current Outpatient Medications:    clindamycin (CLEOCIN) 300 MG capsule, Take 1 capsule (300 mg total) by mouth 4 (four) times daily. X 7 days, Disp: 28 capsule, Rfl: 0   HYDROcodone-acetaminophen (NORCO) 5-325 MG tablet, Take 1 tablet by mouth every 4 (four) hours as needed for severe pain., Disp: 20 tablet, Rfl: 0   metoCLOPramide (REGLAN) 10 MG tablet, Take 1 tablet (10 mg total) by mouth every 6 (six) hours as needed for nausea or vomiting (nausea/headache)., Disp: 8 tablet, Rfl: 0   naproxen (NAPROSYN) 500 MG tablet, Take 1 tablet (500 mg total) by mouth 2 (two) times daily., Disp: 30 tablet, Rfl: 0   Medications ordered in this encounter:  No orders of the defined types were placed in this encounter.   Use link below to get set up at a local Urgent Care.   The info for the after-hours Orthopedic urgent Care is: [phone] [address] [place], [place] [address]  If you have been instructed to have an in-person evaluation today at a local Urgent Care facility, please use the link below. It will take you to a list of all of our available [facility], including address, phone number and hours of operation. Please do not delay care.  [facility]  If you or a family member do not have a primary care provider, use the link below to schedule a visit and establish care. When you choose a [facility] primary care physician or advanced practice provider, you gain a long-term partner in health. Find a Primary Care Provider  Learn more about [facility]'s in-office and virtual care options:  - Get Care Now

## 2021-05-06 NOTE — ED Triage Notes (Signed)
Pt presents with right leg pain after fall last night.

## 2021-05-18 ENCOUNTER — Encounter: Payer: Self-pay | Admitting: Physician Assistant

## 2021-05-18 ENCOUNTER — Other Ambulatory Visit: Payer: Self-pay

## 2021-05-18 ENCOUNTER — Ambulatory Visit (INDEPENDENT_AMBULATORY_CARE_PROVIDER_SITE_OTHER): Payer: Self-pay | Admitting: Physician Assistant

## 2021-05-18 DIAGNOSIS — G8929 Other chronic pain: Secondary | ICD-10-CM

## 2021-05-18 DIAGNOSIS — M25561 Pain in right knee: Secondary | ICD-10-CM

## 2021-05-18 NOTE — Progress Notes (Addendum)
Office Visit Note   Patient: Sherry [name]           Date of Birth: 08-06-1985           MRN: [id number] Visit Date: 05/18/2021              Requested by: No referring provider defined for this encounter. PCP: Pcp, No  Chief Complaint  Patient presents with   Right Knee - Pain    DOI 05/05/2021      HPI: Patient is a pleasant 35 year old woman with a chief complaint of right knee pain.  She is 12 days status post twisting her knee while running and falling onto the outside of her right leg.  She had difficulty bearing weight after this and had a significant swelling.  She was seen and evaluated in the urgent care where an x-ray did demonstrate of her knee demonstrate an avulsion fracture at the lateral aspect of the knee joint concerning for a ligamentous injury.  She has been recommended twice now to get an MRI.  She is ambulating in a knee immobilizer.  She denies any popping when the accident occurred but she twisted and fell back onto the outside of her leg.    Assessment & Plan: Visit Diagnoses:  1. Chronic pain of right knee   2. Acute pain of right knee     Plan: Right knee injury suspicious for ligamentous/ tedon injury.  Patient has several areas of pain and a fair sized effusion.  She declined me to aspirate the effusion today but said she will think about it.  She is going to get a MRI and then follow-up with preferably Dr. August Saucer.  I told her anytime she wants me to aspirate her knee I think she would feel a bit better.  She will contact me.  In the meantime she should ice elevate and stay on crutches and the knee immobilizer.  Follow-Up Instructions: No follow-ups on file.   Ortho Exam  Patient is alert, oriented, no adenopathy, well-dressed, normal affect, normal respiratory effort. Patient is very hesitant to exam.  She has a palpable moderate plus effusion but no cellulitis.  She is exquisitely tender over the lateral femoral condyle also over the superior lateral  pole of the patella.  Difficult to palpate patellar tendon secondary to swelling but she is able to weakly straighten her leg.  She also cannot sustain a straight leg raise for a short period of time.  No real tenderness over the medial joint line.  No tenderness with varus and valgus stressing.  Very hesitant to allow me to do anterior draw and guards.  Distally she has some mild swelling in her ankle distal pulses are intact compartments are soft and compressible nontender cannot bear weight for more than a step or 2 without the knee immobilizer  Imaging: No results found. No images are attached to the encounter.  Labs: Lab Results  Component Value Date   REPTSTATUS 03/15/2019 FINAL 03/13/2019   CULT >=100,000 COLONIES/mL STAPHYLOCOCCUS SAPROPHYTICUS (A) 03/13/2019   LABORGA STAPHYLOCOCCUS SAPROPHYTICUS (A) 03/13/2019     No results found for: ALBUMIN, PREALBUMIN, CBC  No results found for: MG No results found for: VD25OH  No results found for: PREALBUMIN CBC EXTENDED Latest Ref Rng & Units 04/03/2020 03/13/2019  WBC 4.0 - 10.5 K/uL 9.3 13.8(H)  RBC 3.87 - 5.11 MIL/uL 5.06 4.59  HGB 12.0 - 15.0 g/dL 10.2(L) 9.7(L)  HCT 36.0 - 46.0 % 34.4(L) 32.2(L)  PLT  150 - 400 K/uL 400 410(H)  NEUTROABS 1.7 - 7.7 K/uL - 10.0(H)  LYMPHSABS 0.7 - 4.0 K/uL - 2.5     There is no height or weight on file to calculate BMI.  Orders:  Orders Placed This Encounter  Procedures   MR Knee Right w/o contrast   Ambulatory referral to Orthopedic Surgery   No orders of the defined types were placed in this encounter.    Procedures: No procedures performed  Clinical Data: No additional findings.  ROS:  All other systems negative, except as noted in the HPI. Review of Systems  Objective: Vital Signs: LMP 04/24/2021   Specialty Comments:  No specialty comments available.  PMFS History: There are no problems to display for this patient.  Past Medical History:  Diagnosis Date   GERD  (gastroesophageal reflux disease)     No family history on file.  History reviewed. No pertinent surgical history. Social History   Occupational History   Not on file  Tobacco Use   Smoking status: Former    Types: Cigarettes    Quit date: 12/11/2018    Years since quitting: 2.4   Smokeless tobacco: Never  Vaping Use   Vaping Use: Never used  Substance and Sexual Activity   Alcohol use: No   Drug use: No   Sexual activity: Yes    Birth control/protection: None

## 2021-05-20 ENCOUNTER — Telehealth: Payer: Self-pay | Admitting: Orthopedic Surgery

## 2021-05-20 NOTE — Telephone Encounter (Signed)
Called pt 1X left vm for pt to call and set appt for MRI review with Dr. August Saucer after 05/22/21

## 2021-05-21 ENCOUNTER — Telehealth: Payer: Self-pay | Admitting: Orthopedic Surgery

## 2021-05-21 NOTE — Telephone Encounter (Signed)
Pt called to set MRI Review appt but Dr. August Saucer or PA Alford Highland have any opening until 11/30. Pt did not make an appt. Pt hung up the phone

## 2021-05-22 ENCOUNTER — Ambulatory Visit
Admission: RE | Admit: 2021-05-22 | Discharge: 2021-05-22 | Disposition: A | Discharge status: Home / Self Care | Payer: Self-pay | Source: Ambulatory Visit | Attending: Physician Assistant | Admitting: Physician Assistant

## 2021-05-22 ENCOUNTER — Other Ambulatory Visit: Payer: Self-pay

## 2021-05-22 DIAGNOSIS — M25561 Pain in right knee: Secondary | ICD-10-CM

## 2021-05-29 ENCOUNTER — Ambulatory Visit: Payer: Self-pay | Admitting: Orthopaedic Surgery

## 2021-05-29 ENCOUNTER — Other Ambulatory Visit: Payer: Self-pay

## 2021-05-29 ENCOUNTER — Encounter: Payer: Self-pay | Admitting: Orthopaedic Surgery

## 2021-05-29 ENCOUNTER — Ambulatory Visit (INDEPENDENT_AMBULATORY_CARE_PROVIDER_SITE_OTHER): Payer: No Typology Code available for payment source | Admitting: Orthopaedic Surgery

## 2021-05-29 DIAGNOSIS — M25561 Pain in right knee: Secondary | ICD-10-CM

## 2021-05-29 NOTE — Progress Notes (Signed)
   Office Visit Note   Patient: Sherry [name]           Date of Birth: 16-Apr-1986           MRN: [id number] Visit Date: 05/29/2021              Requested by: Persons, [place], [place] [address] [place],  [place] [address] PCP: Pcp, No   Assessment & Plan: Visit Diagnoses:  1. Acute pain of right knee     Plan: MRI of the right knee shows ACL tear, LCL tear, popliteus injury all of which are consistent with multi ligamentous knee injury.  Based on these findings I have recommended surgical consultation with Dr. August Saucer.  She is hesitant to want to undergo surgery but I strongly encouraged that she consider surgery given the severity of her injury and her age and baseline functional level.  I told her that she needs to wear the knee immobilizer when weightbearing on the leg at all times to prevent knee dislocation injury.  Follow-Up Instructions: Return for needs appt with August Saucer for surgical consultation multiligamentous knee.   Orders:  No orders of the defined types were placed in this encounter.  No orders of the defined types were placed in this encounter.     Procedures: No procedures performed   Clinical Data: No additional findings.   Subjective: Chief Complaint  Patient presents with   Right Knee - Pain    HPI  Patient returns today to discuss right knee MRI.  She is a referral from [facility].  Patient states that pain and symptoms are slightly improved.  Review of Systems   Objective: Vital Signs: There were no vitals taken for this visit.  Physical Exam  Ortho Exam  Examination of the right knee shows laxity of the ACL and PCL and LCL and pain with dial test the posterior lateral corner.  No joint line tenderness.  Specialty Comments:  No specialty comments available.  Imaging: No results found.   PMFS History: There are no problems to display for this patient.  Past Medical History:  Diagnosis Date   GERD (gastroesophageal reflux  disease)     History reviewed. No pertinent family history.  History reviewed. No pertinent surgical history. Social History   Occupational History   Not on file  Tobacco Use   Smoking status: Former    Types: Cigarettes    Quit date: 12/11/2018    Years since quitting: 2.4   Smokeless tobacco: Never  Vaping Use   Vaping Use: Never used  Substance and Sexual Activity   Alcohol use: No   Drug use: No   Sexual activity: Yes    Birth control/protection: None

## 2021-06-03 ENCOUNTER — Ambulatory Visit: Payer: No Typology Code available for payment source | Admitting: Orthopedic Surgery

## 2021-06-03 ENCOUNTER — Ambulatory Visit: Payer: No Typology Code available for payment source | Admitting: Orthopaedic Surgery

## 2021-07-23 ENCOUNTER — Ambulatory Visit: Payer: No Typology Code available for payment source | Admitting: Sports Medicine

## 2021-07-27 ENCOUNTER — Telehealth: Payer: No Typology Code available for payment source | Admitting: Physician Assistant

## 2021-07-27 ENCOUNTER — Encounter: Payer: Self-pay | Admitting: Physician Assistant

## 2021-07-27 DIAGNOSIS — K0889 Other specified disorders of teeth and supporting structures: Secondary | ICD-10-CM

## 2021-07-27 MED ORDER — AMOXICILLIN-POT CLAVULANATE 875-125 MG PO TABS: 1.0000 | ORAL_TABLET | Freq: Two times a day (BID) | ORAL | 0 refills | Status: AC

## 2021-07-27 MED ORDER — NAPROXEN 500 MG PO TABS: 500.0000 mg | ORAL_TABLET | Freq: Two times a day (BID) | ORAL | 0 refills | Status: AC

## 2021-07-27 NOTE — Patient Instructions (Signed)
Dafney Philbrick, thank you for joining [employer], PA-C for today's virtual visit.  While this provider is not your primary care provider (PCP), if your PCP is located in our provider database this encounter information will be shared with them immediately following your visit.  Consent: (Patient) Sherry [name] provided verbal consent for this virtual visit at the beginning of the encounter.  Current Medications:  Current Outpatient Medications:    clindamycin (CLEOCIN) 300 MG capsule, Take 1 capsule (300 mg total) by mouth 4 (four) times daily. X 7 days, Disp: 28 capsule, Rfl: 0   HYDROcodone-acetaminophen (NORCO) 5-325 MG tablet, Take 1 tablet by mouth every 4 (four) hours as needed for severe pain., Disp: 20 tablet, Rfl: 0   metoCLOPramide (REGLAN) 10 MG tablet, Take 1 tablet (10 mg total) by mouth every 6 (six) hours as needed for nausea or vomiting (nausea/headache)., Disp: 8 tablet, Rfl: 0   naproxen (NAPROSYN) 500 MG tablet, Take 1 tablet (500 mg total) by mouth 2 (two) times daily., Disp: 30 tablet, Rfl: 0   Medications ordered in this encounter:  No orders of the defined types were placed in this encounter.    *If you need refills on other medications prior to your next appointment, please contact your pharmacy*  Follow-Up: Call back or seek an in-person evaluation if the symptoms worsen or if the condition fails to improve as anticipated.  Other Instructions  You were given a prescription for antibiotics. Please take the antibiotic prescription fully.   Follow up with your regular doctor in 1 week for reassessment and seek care sooner if your symptoms worsen or fail to improve. Dental Pain Dental pain is often a sign that something is wrong with your teeth or gums. It is also something that can occur following dental treatment. If you have dental pain, it is important to contact your dental care provider, especially if the cause of the pain has not been determined.  Dental pain may be of varying intensity and can be caused by many things, including: Tooth decay (cavities or caries). Cavities are caused by bacteria that produce acids that irritate the nerve of your tooth, making it sensitive to air and hot or cold temperatures. This eventually causes discomfort or pain. Abscess or infection. Once the bacteria reach the inner part of the tooth (pulp), a bacterial infection (dental abscess) can occur. Pus typically collects at the end of the root of a tooth. Injury. A crack in the tooth. Gum recession exposing the root, and possibly the nerves, of a tooth. Gum (periodontal)disease. Abnormal grinding or clenching. Poor or improper home care. An unknown reason (idiopathic). Your pain may be mild or severe. It may occur when you are: Chewing. Exposed to hot or cold temperatures. Eating or drinking sugary foods or beverages, such as soda or candy. Your pain may be constant, or it may come and go without cause. Follow these instructions at home: The following actions may help to lessen any discomfort that you are feeling before or after getting dental care. Medicines Take over-the-counter and prescription medicines only as told by your dental care provider. If you were prescribed an antibiotic medicine, take it as told by your dental care provider. Do not stop taking the antibiotic even if you start to feel better. Eating and drinking Avoid foods or drinks that cause you pain, such as: Very hot or very cold foods or drinks. Sweet or sugary foods or drinks. Managing pain and swelling  Ice can sometimes be  used to reduce pain and swelling, especially if the pain is following dental treatment. If directed, put ice on the painful area of your face. To do this: Put ice in a plastic bag. Place a towel between your skin and the bag. Leave the ice on for 20 minutes, 2-3 times a day. Remove the ice if your skin turns bright red. This is very important. If you  cannot feel pain, heat, or cold, you have a greater risk of damage to the area. Brushing your teeth To keep your mouth and gums healthy, brush your teeth twice a day using a fluoride toothpaste. Use a toothpaste made for sensitive teeth as directed by your dental care provider, especially if the root is exposed. Always brush your teeth with a soft-bristled toothbrush. This will help prevent irritation to your gums. General instructions Floss at least once a day. Do not apply heat to the outside of the face. Gargle with a mixture of salt and water 3-4 times a day or as needed. To make salt water, completely dissolve -1 tsp (3-6 g) of salt in 1 cup (237 mL) of warm water. Keep all follow-up visits. This is important. Contact a dental care provider if: You have any unexplained dental pain. Your pain is not controlled with medicines. Your symptoms get worse. You have new symptoms. Get help right away if: You are unable to open your mouth. You are having trouble breathing or swallowing. You have a fever. You notice that your face, neck, or jaw is swollen. These symptoms may represent a serious problem that is an emergency. Do not wait to see if the symptoms will go away. Get medical help right away. Call your local emergency services (911 in the [place] Do not drive yourself to the hospital. Summary Dental pain may be caused by many things, including tooth decay and infection. Your pain may be mild or severe. Take over-the-counter and prescription medicines only as told by your dental care provider. Watch your dental pain for any changes. Let your dental care provider know if your symptoms get worse. This information is not intended to replace advice given to you by your health care provider. Make sure you discuss any questions you have with your health care provider. Document Revised: 04/02/2020 Document Reviewed: 04/02/2020 Elsevier Patient Education  2022 [employer].    If you have  been instructed to have an in-person evaluation today at a local Urgent Care facility, please use the link below. It will take you to a list of all of our available [facility], including address, phone number and hours of operation. Please do not delay care.  [facility]  If you or a family member do not have a primary care provider, use the link below to schedule a visit and establish care. When you choose a [facility] primary care physician or advanced practice provider, you gain a long-term partner in health. Find a Primary Care Provider  Learn more about [facility]'s in-office and virtual care options:  - Get Care Now

## 2021-07-27 NOTE — Progress Notes (Signed)
Ms. Sherry [name], gunnerson are scheduled for a virtual visit with your provider today.    Just as we do with appointments in the office, we must obtain your consent to participate.  Your consent will be active for this visit and any virtual visit you may have with one of our providers in the next 365 days.    If you have a MyChart account, I can also send a copy of this consent to you electronically.  All virtual visits are billed to your insurance company just like a traditional visit in the office.  As this is a virtual visit, video technology does not allow for your provider to perform a traditional examination.  This may limit your provider's ability to fully assess your condition.  If your provider identifies any concerns that need to be evaluated in person or the need to arrange testing such as labs, EKG, etc, we will make arrangements to do so.    Although advances in technology are sophisticated, we cannot ensure that it will always work on either your end or our end.  If the connection with a video visit is poor, we may have to switch to a telephone visit.  With either a video or telephone visit, we are not always able to ensure that we have a secure connection.   I need to obtain your verbal consent now.   Are you willing to proceed with your visit today?   Opal Halliwell has provided verbal consent on 07/27/2021 for a virtual visit (video or telephone).   Karrie Meres, [name] 07/27/2021  2:38 PM   Date:  07/27/2021   ID:  Barbaraann Faster Spargur, DOB 03/28/1986, MRN [id number]  Patient Location: Home Provider Location: Home Office   Participants: Patient and Provider for Visit and Wrap up  Method of visit: Video  Location of Patient: Home Location of Provider: Home Office Consent was obtain for visit over the video. Services rendered by provider: Visit was performed via video  A video enabled telemedicine application was used and I verified that I am speaking with the correct person using two  identifiers.  PCP:  Pcp, No   Chief Complaint:  dental pain  History of Present Illness:    Sherry [name] is a 36 y.o. female with history as stated below. Presents video telehealth for an acute care visit  Pt is c/o a hole in her tooth and a cracked wisdom tooth bilaterally. She states she had a dental abscess to the left lower mouth several months ago and Is still having pain and an abnormal taste in her mouth since then. She also reports pain to the right lower mouth. Denies fevers, chills, facial swelling, swelling beneath the tongue or chin. Does not have a dentist  Past Medical, Surgical, Social History, Allergies, and Medications have been Reviewed.  Past Medical History:  Diagnosis Date   GERD (gastroesophageal reflux disease)     Current Meds  Medication Sig   amoxicillin-clavulanate (AUGMENTIN) 875-125 MG tablet Take 1 tablet by mouth 2 (two) times daily for 7 days.   naproxen (NAPROSYN) 500 MG tablet Take 1 tablet (500 mg total) by mouth 2 (two) times daily for 7 days.     Allergies:   Fish allergy and Shellfish allergy   ROS See HPI for history of present illness.  Physical Exam Constitutional:      General: She is not in acute distress.    Appearance: She is not ill-appearing.  HENT:     Mouth/Throat:  Comments: Normal voice, no trismus.  No obvious facial swelling  MDM: dental pain. Likely dental infection. No gross abscess reported and no evidence of deep space infection on limited exam today. Rx for abx and pain meds given. Advised on f/u with dentist and strict return precautions. She voices understanding of the plan and reasons to return.             There are no diagnoses linked to this encounter.   Time:   Today, I have spent 10 minutes with the patient with telehealth technology discussing the above problems, reviewing the chart, previous notes, medications and orders.    Tests Ordered: No orders of the defined types were placed in this  encounter.   Medication Changes: Meds ordered this encounter  Medications   amoxicillin-clavulanate (AUGMENTIN) 875-125 MG tablet    Sig: Take 1 tablet by mouth 2 (two) times daily for 7 days.    Dispense:  14 tablet    Refill:  0    Order Specific Question:   Supervising Provider    Answer:   MILLER, BRIAN [[id number]]   naproxen (NAPROSYN) 500 MG tablet    Sig: Take 1 tablet (500 mg total) by mouth 2 (two) times daily for 7 days.    Dispense:  14 tablet    Refill:  0    Order Specific Question:   Supervising Provider    Answer:   Eber Hong [[id number]]     Disposition:  Follow up  Signed, Karrie Meres, PA-C  07/27/2021 2:38 PM

## 2021-07-29 ENCOUNTER — Ambulatory Visit: Payer: No Typology Code available for payment source | Admitting: Family

## 2021-08-31 ENCOUNTER — Telehealth: Payer: No Typology Code available for payment source | Admitting: Physician Assistant

## 2021-08-31 DIAGNOSIS — T50905A Adverse effect of unspecified drugs, medicaments and biological substances, initial encounter: Secondary | ICD-10-CM

## 2021-08-31 NOTE — Patient Instructions (Signed)
°  [name], thank you for joining Margaretann Loveless, PA-C for today's virtual visit.  While this provider is not your primary care provider (PCP), if your PCP is located in our provider database this encounter information will be shared with them immediately following your visit.  Consent: (Patient) Sherry [name] provided verbal consent for this virtual visit at the beginning of the encounter.  Current Medications:  Current Outpatient Medications:    naproxen (NAPROSYN) 500 MG tablet, Take 1 tablet (500 mg total) by mouth 2 (two) times daily., Disp: 30 tablet, Rfl: 0   Medications ordered in this encounter:  No orders of the defined types were placed in this encounter.    *If you need refills on other medications prior to your next appointment, please contact your pharmacy*  Follow-Up: Call back or seek an in-person evaluation if the symptoms worsen or if the condition fails to improve as anticipated.  Other Instructions Ferrous Gluconate instead of ferrous sulfate   Dentist: Chandra Batch [phone]  [facility] [phone]  Urgent Tooth [phone]  [facility] [place] [phone]    If you have been instructed to have an in-person evaluation today at a local Urgent Care facility, please use the link below. It will take you to a list of all of our available [facility], including address, phone number and hours of operation. Please do not delay care.  [facility]  If you or a family member do not have a primary care provider, use the link below to schedule a visit and establish care. When you choose a [facility] primary care physician or advanced practice provider, you gain a long-term partner in health. Find a Primary Care Provider  Learn more about [facility]'s in-office and virtual care options: [facility] - Get Care Now

## 2021-08-31 NOTE — Progress Notes (Signed)
Virtual Visit Consent   Sherry [name], you are scheduled for a virtual visit with a [facility] provider today.     Just as with appointments in the office, your consent must be obtained to participate.  Your consent will be active for this visit and any virtual visit you may have with one of our providers in the next 365 days.     If you have a MyChart account, a copy of this consent can be sent to you electronically.  All virtual visits are billed to your insurance company just like a traditional visit in the office.    As this is a virtual visit, video technology does not allow for your provider to perform a traditional examination.  This may limit your provider's ability to fully assess your condition.  If your provider identifies any concerns that need to be evaluated in person or the need to arrange testing (such as labs, EKG, etc.), we will make arrangements to do so.     Although advances in technology are sophisticated, we cannot ensure that it will always work on either your end or our end.  If the connection with a video visit is poor, the visit may have to be switched to a telephone visit.  With either a video or telephone visit, we are not always able to ensure that we have a secure connection.     I need to obtain your verbal consent now.   Are you willing to proceed with your visit today?    Sherry [name] has provided verbal consent on 08/31/2021 for a virtual visit (video or telephone).   Sherry Loveless, PA-C   Date: 08/31/2021 8:57 AM   Virtual Visit via Video Note   I, Sherry [name], connected with  Sherry [name]  ([id number], 1985/12/12) on 08/31/21 at  8:30 AM EST by a video-enabled telemedicine application and verified that I am speaking with the correct person using two identifiers.  Location: Patient: Virtual Visit Location Patient: Home Provider: Virtual Visit Location Provider: Home Office   I discussed the limitations of evaluation and management by  telemedicine and the availability of in person appointments. The patient expressed understanding and agreed to proceed.    History of Present Illness: Sherry [name] is a 36 y.o. who identifies as a female who was assigned female at birth, and is being seen today for medical questions. States taking ferrous sulfate 325mg  every other day and not sure if it is working. Causes GI upset. Did take with food and that helped. States she is not sure if it is working. Last labs 03/2020.  Was given Augmentin in January 2023 for possible dental infection. She states she has not been able to complete therapy since she is having issues swallowing the medication.    Problems: There are no problems to display for this patient.   Allergies:  Allergies  Allergen Reactions   Fish Allergy    Shellfish Allergy    Medications:  Current Outpatient Medications:    naproxen (NAPROSYN) 500 MG tablet, Take 1 tablet (500 mg total) by mouth 2 (two) times daily., Disp: 30 tablet, Rfl: 0  Observations/Objective: Patient is well-developed, well-nourished in no acute distress.  Resting comfortably at home.  Head is normocephalic, atraumatic.  No labored breathing.  Speech is clear and coherent with logical content.  Patient is alert and oriented at baseline.    Assessment and Plan: 1. Adverse effect of drug, initial encounter  - Discussed changing ferrous sulfate to  ferrous gluconate to see if she tolerates better - May cut augmentin in half and take with applesauce - Follow up with dentist for broken tooth - Follow up with PCP for labs to re-evaluate anemia  Follow Up Instructions: I discussed the assessment and treatment plan with the patient. The patient was provided an opportunity to ask questions and all were answered. The patient agreed with the plan and demonstrated an understanding of the instructions.  A copy of instructions were sent to the patient via MyChart unless otherwise noted below.   The  patient was advised to call back or seek an in-person evaluation if the symptoms worsen or if the condition fails to improve as anticipated.  Time:  I spent 14 minutes with the patient via telehealth technology discussing the above problems/concerns.    Sherry Loveless, PA-C

## 2021-09-06 ENCOUNTER — Encounter: Payer: Self-pay | Admitting: Physician Assistant

## 2021-09-06 ENCOUNTER — Telehealth: Payer: No Typology Code available for payment source | Admitting: Physician Assistant

## 2021-09-06 DIAGNOSIS — G44209 Tension-type headache, unspecified, not intractable: Secondary | ICD-10-CM

## 2021-09-06 DIAGNOSIS — K0889 Other specified disorders of teeth and supporting structures: Secondary | ICD-10-CM

## 2021-09-06 NOTE — Patient Instructions (Signed)
Sherry [name], thank you for joining [name], PA-C for today's virtual visit.  While this provider is not your primary care provider (PCP), if your PCP is located in our provider database this encounter information will be shared with them immediately following your visit.  Consent: (Patient) Sherry [name] provided verbal consent for this virtual visit at the beginning of the encounter.  Current Medications:  Current Outpatient Medications:    naproxen (NAPROSYN) 500 MG tablet, Take 1 tablet (500 mg total) by mouth 2 (two) times daily., Disp: 30 tablet, Rfl: 0   Medications ordered in this encounter:  No orders of the defined types were placed in this encounter.    *If you need refills on other medications prior to your next appointment, please contact your pharmacy*  Follow-Up: Call back or seek an in-person evaluation if the symptoms worsen or if the condition fails to improve as anticipated.  Other Instructions I do encourage you to increase your water intake, continue using ibuprofen as needed.  I strongly encourage you to follow-up with the dental resources that were provided to you during your August 31, 2021 virtual visit.  I do encourage you to call and make an appointment to be seen in person by me this week at Primary [facility] at [facility].  The phone number is [phone]   If you have been instructed to have an in-person evaluation today at a local Urgent Care facility, please use the link below. It will take you to a list of all of our available [facility], including address, phone number and hours of operation. Please do not delay care.  [facility]  If you or a family member do not have a primary care provider, use the link below to schedule a visit and establish care. When you choose a [facility] primary care physician or advanced practice provider, you gain a long-term partner in health. Find a Primary Care Provider  Learn more about  [facility] in-office and virtual care options: [facility] [facility] Get Care Now   General Headache Without Cause A headache is pain or discomfort felt around the head or neck area. There are many causes and types of headaches. A few common types include: Tension headaches. Migraine headaches. Cluster headaches. Chronic daily headaches. Sometimes, the specific cause of a headache may not be found. Follow these instructions at home: Watch your condition for any changes. Let your health care provider know about them. Take these steps to help with your condition: Managing pain   Take over-the-counter and prescription medicines only as told by your health care provider. Treatment may include medicines for pain that are taken by mouth or applied to the skin. Lie down in a dark, quiet room when you have a headache. Keep lights dim if bright lights bother you or make your headaches worse. If directed, put ice on your head and neck area: Put ice in a plastic bag. Place a towel between your skin and the bag. Leave the ice on for 20 minutes, 2-3 times per day. Remove the ice if your skin turns bright red. This is very important. If you cannot feel pain, heat, or cold, you have a greater risk of damage to the area. If directed, apply heat to the affected area. Use the heat source that your health care provider recommends, such as a moist heat pack or a heating pad. Place a towel between your skin and the heat source. Leave the heat on for  20-30 minutes. Remove the heat if your skin turns bright red. This is especially important if you are unable to feel pain, heat, or cold. You have a greater risk of getting burned. Eating and drinking Eat meals on a regular schedule. If you drink alcohol: Limit how much you have to: 0-1 drink a day for women who are not pregnant. 0-2 drinks a day for men. Know how much alcohol is in a drink. In the [place] one drink equals one 12 oz bottle of beer (355 mL), one 5 oz  glass of wine (148 mL), or one 1 oz glass of hard liquor (44 mL). Stop drinking caffeine, or decrease the amount of caffeine you drink. Drink enough fluid to keep your urine pale yellow. General instructions  Keep a headache journal to help find out what may trigger your headaches. For example, write down: What you eat and drink. How much sleep you get. Any change to your diet or medicines. Try massage or other relaxation techniques. Limit stress. Sit up straight, and do not tense your muscles. Do not use any products that contain nicotine or tobacco. These products include cigarettes, chewing tobacco, and vaping devices, such as e-cigarettes. If you need help quitting, ask your health care provider. Exercise regularly as told by your health care provider. Sleep on a regular schedule. Get 7-9 hours of sleep each night, or the amount recommended by your health care provider. Keep all follow-up visits. This is important. Contact a health care provider if: Medicine does not help your symptoms. You have a headache that is different from your usual headache. You have nausea or you vomit. You have a fever. Get help right away if: Your headache: Becomes severe quickly. Gets worse after moderate to intense physical activity. You have any of these symptoms: Repeated vomiting. Pain or stiffness in your neck. Changes to your vision. Pain in an eye or ear. Problems with speech. Muscular weakness or loss of muscle control. Loss of balance or coordination. You feel faint or pass out. You have confusion. You have a seizure. These symptoms may represent a serious problem that is an emergency. Do not wait to see if the symptoms will go away. Get medical help right away. Call your local emergency services (911 in the [place] Do not drive yourself to the hospital. Summary A headache is pain or discomfort felt around the head or neck area. There are many causes and types of headaches. In some  cases, the cause may not be found. Keep a headache journal to help find out what may trigger your headaches. Watch your condition for any changes. Let your health care provider know about them. Contact a health care provider if you have a headache that is different from the usual headache, or if your symptoms are not helped by medicine. Get help right away if your headache becomes severe, you vomit, you have a loss of vision, you lose your balance, or you have a seizure. This information is not intended to replace advice given to you by your health care provider. Make sure you discuss any questions you have with your health care provider. Document Revised: 11/26/2020 Document Reviewed: 11/26/2020 Elsevier Patient Education  2022 [employer].

## 2021-09-06 NOTE — Progress Notes (Signed)
Virtual Visit Consent   Sherry [name], you are scheduled for a virtual visit with a Sherry [name].     Just as with appointments in the office, your consent must be obtained to participate.  Your consent will be active for this visit and any virtual visit you may have with one of our providers in the next 365 days.     If you have a MyChart account, a copy of this consent can be sent to you electronically.  All virtual visits are billed to your insurance company just like a traditional visit in the office.    As this is a virtual visit, video technology does not allow for your provider to perform a traditional examination.  This may limit your provider's ability to fully assess your condition.  If your provider identifies any concerns that need to be evaluated in person or the need to arrange testing (such as labs, EKG, etc.), we will make arrangements to do so.     Although advances in technology are sophisticated, we cannot ensure that it will always work on either your end or our end.  If the connection with a video visit is poor, the visit may have to be switched to a telephone visit.  With either a video or telephone visit, we are not always able to ensure that we have a secure connection.     Patient unable to turn on video, agrees to continue with voice visit.  I need to obtain your verbal consent now.   Are you willing to proceed with your visit [name]?    Sherry [name] has provided verbal consent on 09/06/2021 for a virtual visit (video or telephone).   Sherry Jaffe, PA-C   Date: 09/06/2021 5:27 PM   Virtual Visit via Video Note   I, Sherry [name], connected with  Sherry [name]  ([id number], [birth year]) on 09/06/21 at  5:15 PM EST by a video-enabled telemedicine application and verified that I am speaking with the correct person using two identifiers.  Location: Patient: Virtual Visit Location Patient: Home Provider: Virtual Visit Location Provider: Home Office    I discussed the limitations of evaluation and management by telemedicine and the availability of in person appointments. The patient expressed understanding and agreed to proceed.    History of Present Illness: Sherry [name] is a 36 y.o. who identifies as a female who was assigned female at birth, states that she has been having headaches for the last couple of days, states that they are constant dull headache in the middle of her head.  States that she has been taking the antibiotic that was prescribed for her dental pain, states she is unsure if the antibiotic is causing the headaches.  States that she feels that her broken tooth is becoming more sensitive in her mouth since starting the antibiotic.  States that she also is concerned that her iron may be low, states that she did purchase the over-the-counter iron and did take the iron tablet as directed.  States that she has been taking ibuprofen without much relief, states that she has drank 2 bottles of water [name].  HPI: HPI  Problems: There are no problems to display for this patient.   Allergies:  Allergies  Allergen Reactions   Fish Allergy    Shellfish Allergy    Medications:  Current Outpatient Medications:    naproxen (NAPROSYN) 500 MG tablet, Take 1 tablet (500 mg total) by mouth 2 (two) times daily., Disp: 30 tablet,  Rfl: 0  Observations/Objective: Medical history and current medications reviewed, no physical exam completed  Assessment and Plan: 1. Acute non intractable tension-type headache  2. Pain, dental  Patient encouraged to increase water intake, continue ibuprofen as needed.  Patient was given dental resources during virtual visit on August 31, 2021, patient encouraged to follow-up with dental resources promptly.  Patient does not have primary care provider, patient was encouraged to have in person visit for evaluation of iron deficiency anemia and other causes of headaches.  Patient was given information on  how to contact this provider at [facility] due to this provider scheduled having availability this week.  Patient is thankful.  Follow Up Instructions: I discussed the assessment and treatment plan with the patient. The patient was provided an opportunity to ask questions and all were answered. The patient agreed with the plan and demonstrated an understanding of the instructions.  A copy of instructions were sent to the patient via MyChart unless otherwise noted below.     The patient was advised to call back or seek an in-person evaluation if the symptoms worsen or if the condition fails to improve as anticipated.  Time:  I spent 12 minutes with the patient via telehealth technology discussing the above problems/concerns.    Kasandra Knudsen Mayers, PA-C

## 2021-11-13 ENCOUNTER — Emergency Department (HOSPITAL_BASED_OUTPATIENT_CLINIC_OR_DEPARTMENT_OTHER): Payer: Commercial Managed Care - HMO

## 2021-11-13 ENCOUNTER — Other Ambulatory Visit: Payer: Self-pay

## 2021-11-13 ENCOUNTER — Emergency Department (HOSPITAL_BASED_OUTPATIENT_CLINIC_OR_DEPARTMENT_OTHER)
Admission: EM | Admit: 2021-11-13 | Discharge: 2021-11-13 | Disposition: A | Discharge status: Home / Self Care | Payer: Commercial Managed Care - HMO | Attending: Emergency Medicine | Admitting: Emergency Medicine

## 2021-11-13 ENCOUNTER — Encounter (HOSPITAL_BASED_OUTPATIENT_CLINIC_OR_DEPARTMENT_OTHER): Payer: Self-pay

## 2021-11-13 DIAGNOSIS — R109 Unspecified abdominal pain: Secondary | ICD-10-CM | POA: Diagnosis present

## 2021-11-13 LAB — URINALYSIS, ROUTINE W REFLEX MICROSCOPIC
Bilirubin Urine: NEGATIVE
Glucose, UA: NEGATIVE mg/dL
Hgb urine dipstick: NEGATIVE
Ketones, ur: NEGATIVE mg/dL
Leukocytes,Ua: NEGATIVE
Nitrite: NEGATIVE
Protein, ur: NEGATIVE mg/dL
Specific Gravity, Urine: 1.013 (ref 1.005–1.030)
pH: 6 (ref 5.0–8.0)

## 2021-11-13 LAB — PREGNANCY, URINE: Preg Test, Ur: NEGATIVE

## 2021-11-13 MED ORDER — HYDROCODONE-ACETAMINOPHEN 5-325 MG PO TABS
2.0000 | ORAL_TABLET | Freq: Once | ORAL | Status: AC
  Administered 2021-11-13: 2 via ORAL
  Filled 2021-11-13: qty 2

## 2021-11-13 MED ORDER — KETOROLAC TROMETHAMINE 30 MG/ML IJ SOLN
60.0000 mg | Freq: Once | INTRAMUSCULAR | Status: DC
  Filled 2021-11-13: qty 2

## 2021-11-13 MED ORDER — CYCLOBENZAPRINE HCL 10 MG PO TABS: 10.0000 mg | ORAL_TABLET | Freq: Three times a day (TID) | ORAL | 0 refills | Status: DC | PRN

## 2021-11-13 NOTE — Discharge Instructions (Signed)
Take ibuprofen 600 mg every 6 hours as needed for pain.  Begin taking Flexeril as prescribed as needed for pain not relieved with ibuprofen. ? ?Follow-up with your primary doctor in the next 3 to 4 days if not improving. ?

## 2021-11-13 NOTE — ED Triage Notes (Addendum)
Pt presents POV with c/o mid-low back pain beginning yesterday morning.  ? ?Denies any fall or injury. ? ?Denies fever, nausea, vomiting, diarrhea. ? ?Denies any urinary symptoms. ? ?Pt ambulatory to triage, in NAD, GCS 15.  ?

## 2021-11-13 NOTE — ED Notes (Signed)
Pt verbalizes understanding of discharge instructions. Opportunity for questioning and answers were provided. Pt discharged from ED to home with friend.    

## 2021-11-13 NOTE — ED Provider Notes (Signed)
?[facility] EMERGENCY DEPT ?Provider Note ? ? ?CSN: [id number] ?Arrival date & time: 11/13/21  0012 ? ?  ? ?History ? ?Chief Complaint  ?Patient presents with  ? Back Pain  ? ? ?Sherry [name] is a 36 y.o. female. ? ?Patient is a 36 year old female with no significant past medical history.  Patient presenting today with complaints of flank pain.  Patient describes going to bed last night with a sensation of having to urinate, however she was too tired and went to sleep.  She reports holding her urine for 8 hours.  When she urinated in the morning she started with pain in her back radiating up into both flanks.  She denies any fevers or chills.  She denies any radiation of her pain into her legs.  She denies any bowel or bladder incontinence.  She denies any specific injury or trauma.  Pain is worse with movement and there are no alleviating factors. ? ?The history is provided by the patient.  ? ?  ? ?Home Medications ?Prior to Admission medications   ?Medication Sig Start Date End Date Taking? Authorizing Provider  ?naproxen (NAPROSYN) 500 MG tablet Take 1 tablet (500 mg total) by mouth 2 (two) times daily. 03/02/21   Maudie Flakes, MD  ?   ? ?Allergies    ?Fish allergy and Shellfish allergy   ? ?Review of Systems   ?Review of Systems  ?All other systems reviewed and are negative. ? ?Physical Exam ?Updated Vital Signs ?BP (!) 157/116 (BP Location: Right Arm)   Pulse 92   Temp 98.7 ?F (37.1 ?C)   Resp 18   Ht 5\' 5"  (1.651 m)   Wt 89.8 kg   LMP 10/19/2021 (Approximate)   SpO2 100%   BMI 32.95 kg/m?  ?Physical Exam ?Vitals and nursing note reviewed.  ?Constitutional:   ?   General: She is not in acute distress. ?   Appearance: She is well-developed. She is not diaphoretic.  ?HENT:  ?   Head: Normocephalic and atraumatic.  ?Cardiovascular:  ?   Rate and Rhythm: Normal rate and regular rhythm.  ?   Heart sounds: No murmur heard. ?  No friction rub. No gallop.  ?Pulmonary:  ?   Effort: Pulmonary  effort is normal. No respiratory distress.  ?   Breath sounds: Normal breath sounds. No wheezing.  ?Abdominal:  ?   General: Bowel sounds are normal. There is no distension.  ?   Palpations: Abdomen is soft.  ?   Tenderness: There is no abdominal tenderness. There is right CVA tenderness and left CVA tenderness. There is no guarding or rebound.  ?Musculoskeletal:     ?   General: Normal range of motion.  ?   Cervical back: Normal range of motion and neck supple.  ?   Comments: There is tenderness to palpation in both flanks  ?Skin: ?   General: Skin is warm and dry.  ?Neurological:  ?   General: No focal deficit present.  ?   Mental Status: She is alert and oriented to person, place, and time.  ? ? ?ED Results / Procedures / Treatments   ?Labs ?(all labs ordered are listed, but only abnormal results are displayed) ?Labs Reviewed  ?PREGNANCY, URINE  ?URINALYSIS, ROUTINE W REFLEX MICROSCOPIC  ? ? ?EKG ?None ? ?Radiology ?No results found. ? ?Procedures ?Procedures  ? ? ?Medications Ordered in ED ?Medications  ?ketorolac (TORADOL) 30 MG/ML injection 60 mg (has no administration in time range)  ?HYDROcodone-acetaminophen (  NORCO/VICODIN) 5-325 MG per tablet 2 tablet (has no administration in time range)  ? ? ?ED Course/ Medical Decision Making/ A&P ? ?Patient is a 36 year old female presenting with complaints of bilateral flank pain that began after urinating after holding her urine all night long.  Patient's presentation concerning for a possible renal calculus, however none showed up on the CT scan.  There is no evidence for infection in her urine.  Patient feeling better after receiving medications here in the ER.  I have identified no emergent pathology and feel as though patient can safely be discharged.  She will be sent home with ibuprofen and Flexeril.  To follow-up as needed if not improving.  I suspect a musculoskeletal cause as nothing else has revealed any abnormality. ? ?Final Clinical Impression(s) / ED  Diagnoses ?Final diagnoses:  ?None  ? ? ?Rx / DC Orders ?ED Discharge Orders   ? ? None  ? ?  ? ? ?  ?Veryl Speak, MD ?11/13/21 [id number] ? ?

## 2021-12-29 ENCOUNTER — Ambulatory Visit: Payer: Commercial Managed Care - HMO | Admitting: Physician Assistant

## 2022-02-08 ENCOUNTER — Telehealth: Payer: Self-pay | Admitting: Orthopedic Surgery

## 2022-02-08 NOTE — Telephone Encounter (Signed)
Patient called in stating she needs a letter stating she has a a partial tear (disability) in her right leg, please advise needs done by 08/04 please

## 2022-02-09 ENCOUNTER — Telehealth: Payer: Self-pay | Admitting: Orthopaedic Surgery

## 2022-02-09 NOTE — Telephone Encounter (Signed)
Sure we can write letter stating her specific knee injuries.  We cannot comment on her specific limitations or impairments.  Thanks.

## 2022-02-09 NOTE — Telephone Encounter (Signed)
See note on other phone message.

## 2022-02-09 NOTE — Telephone Encounter (Signed)
Patient called back in regards to the letter she was needing that Dr. Roda Shutters approved of this morning (previous telephone encounter). She would like to know if it can be uploaded to her mychart. CB # [phone]

## 2022-02-09 NOTE — Telephone Encounter (Signed)
Tried again. Call again could not be completed at this time.

## 2022-02-09 NOTE — Telephone Encounter (Signed)
Tried to call patient. Call "couldn't be completed at this time". Will try again later.

## 2022-02-10 NOTE — Telephone Encounter (Signed)
Tried to call again. Call cannot be completed as dialed. Closing note.

## 2022-02-10 NOTE — Telephone Encounter (Signed)
Tried to call again. Call cannot be completed. Closing note.

## 2022-03-25 ENCOUNTER — Telehealth: Payer: Commercial Managed Care - HMO | Admitting: Physician Assistant

## 2022-03-25 DIAGNOSIS — H60332 Swimmer's ear, left ear: Secondary | ICD-10-CM

## 2022-03-25 MED ORDER — CIPROFLOXACIN-DEXAMETHASONE 0.3-0.1 % OT SUSP: 4.0000 [drp] | Freq: Two times a day (BID) | OTIC | 0 refills | Status: DC

## 2022-03-25 MED ORDER — AMOXICILLIN-POT CLAVULANATE 875-125 MG PO TABS: 1.0000 | ORAL_TABLET | Freq: Two times a day (BID) | ORAL | 0 refills | Status: DC

## 2022-03-25 NOTE — Progress Notes (Signed)
I have spent 5 minutes in review of e-visit questionnaire, review and updating patient chart, medical decision making and response to patient.   Saahil Herbster Cody Draedyn Weidinger, PA-C    

## 2022-03-25 NOTE — Progress Notes (Signed)
E Visit for Swimmer's Ear  We are sorry that you are not feeling well. Here is how we plan to help!  Based on what you have shared with me it looks like you have swimmers ear. Swimmer's ear is a redness or swelling, irritation, or infection of your outer ear canal.  These symptoms usually occur within a few days of swimming.  Your ear canal is a tube that goes from the opening of the ear to the eardrum.  When water stays in your ear canal, germs can grow.  This is a painful condition that often happens to children and swimmers of all ages.  It is not contagious and oral antibiotics are not required to treat uncomplicated swimmer's ear.  The usual symptoms include: Itching inside the ear, Redness or a sense of swelling in the ear, Pain when the ear is tugged on when pressure is placed on the ear, Pus draining from the infected ear. and I have prescribed: Ciprofloxin 0.2% and hydrocortisone 1% otic suspension 3 drops in affected ears twice daily for 7 days  Augmentin one tablet by mouth twice a day for 10 days  In certain cases swimmer's ear may progress to a more serious bacterial infection of the middle or inner ear.  If you have a fever 102 and up and significantly worsening symptoms, this could indicate a more serious infection moving to the middle/inner and needs face to face evaluation in an office by a provider.  Your symptoms should improve over the next 3 days and should resolve in about 7 days.  HOME CARE:  Wash your hands frequently. Do not place the tip of the bottle on your ear or touch it with your fingers. You can take Acetominophen 650 mg every 4-6 hours as needed for pain.  If pain is severe or moderate, you can apply a heating pad (set on low) or hot water bottle (wrapped in a towel) to outer ear for 20 minutes.  This will also increase drainage. Avoid ear plugs Do not use Q-tips After showers, help the water run out by tilting your head to one side.  GET HELP RIGHT AWAY  IF:  Fever is over 102.2 degrees. You develop progressive ear pain or hearing loss. Ear symptoms persist longer than 3 days after treatment.  MAKE SURE YOU:  Understand these instructions. Will watch your condition. Will get help right away if you are not doing well or get worse.  TO PREVENT SWIMMER'S EAR: Use a bathing cap or custom fitted swim molds to keep your ears dry. Towel off after swimming to dry your ears. Tilt your head or pull your earlobes to allow the water to escape your ear canal. If there is still water in your ears, consider using a hairdryer on the lowest setting.   Thank you for choosing an e-visit.  Your e-visit answers were reviewed by a board certified advanced clinical practitioner to complete your personal care plan. Depending upon the condition, your plan could have included both over the counter or prescription medications.  Please review your pharmacy choice. Make sure the pharmacy is open so you can pick up prescription now. If there is a problem, you may contact your provider through [employer] and have the prescription routed to another pharmacy.  Your safety is important to [place]. If you have drug allergies check your prescription carefully.   For the next 24 hours you can use MyChart to ask questions about today's visit, request a non-urgent call back,  or ask for a work or school excuse. You will get an email in the next two days asking about your experience. I hope that your e-visit has been valuable and will speed your recovery.

## 2022-04-23 ENCOUNTER — Telehealth: Payer: Commercial Managed Care - HMO | Admitting: Physician Assistant

## 2022-04-23 DIAGNOSIS — D509 Iron deficiency anemia, unspecified: Secondary | ICD-10-CM

## 2022-04-23 DIAGNOSIS — T887XXA Unspecified adverse effect of drug or medicament, initial encounter: Secondary | ICD-10-CM

## 2022-04-23 NOTE — Patient Instructions (Addendum)
  [name], thank you for joining Mar Daring, PA-C for today's virtual visit.  While this provider is not your primary care provider (PCP), if your PCP is located in our provider database this encounter information will be shared with them immediately following your visit.   Consent: (Patient) Sherry [name] provided verbal consent for this virtual visit at the beginning of the encounter.  Current Medications:  Current Outpatient Medications:    amoxicillin-clavulanate (AUGMENTIN) 875-125 MG tablet, Take 1 tablet by mouth 2 (two) times daily., Disp: 20 tablet, Rfl: 0   Medications ordered in this encounter:  No orders of the defined types were placed in this encounter.    *If you need refills on other medications prior to your next appointment, please contact your pharmacy*  Follow-Up: Call back or seek an in-person evaluation if the symptoms worsen or if the condition fails to improve as anticipated.  [facility] [phone]  Other Instructions  Ferrous Glycinate, Slow Release Iron, SlowFe - Use instead of ferrous sulfate that is causing side effects   If you have been instructed to have an in-person evaluation today at a local Urgent Care facility, please use the link below. It will take you to a list of all of our available [facility], including address, phone number and hours of operation. Please do not delay care.  [facility]  If you or a family member do not have a primary care provider, use the link below to schedule a visit and establish care. When you choose a [facility] primary care physician or advanced practice provider, you gain a long-term partner in health. Find a Primary Care Provider  Learn more about [facility] in-office and virtual care options: [facility]

## 2022-04-23 NOTE — Progress Notes (Signed)
Virtual Visit Consent   Sherry [name], you are scheduled for a virtual visit with a [facility] provider today. Just as with appointments in the office, your consent must be obtained to participate. Your consent will be active for this visit and any virtual visit you may have with one of our providers in the next 365 days. If you have a MyChart account, a copy of this consent can be sent to you electronically.  As this is a virtual visit, video technology does not allow for your provider to perform a traditional examination. This may limit your provider's ability to fully assess your condition. If your provider identifies any concerns that need to be evaluated in person or the need to arrange testing (such as labs, EKG, etc.), we will make arrangements to do so. Although advances in technology are sophisticated, we cannot ensure that it will always work on either your end or our end. If the connection with a video visit is poor, the visit may have to be switched to a telephone visit. With either a video or telephone visit, we are not always able to ensure that we have a secure connection.  By engaging in this virtual visit, you consent to the provision of healthcare and authorize for your insurance to be billed (if applicable) for the services provided during this visit. Depending on your insurance coverage, you may receive a charge related to this service.  I need to obtain your verbal consent now. Are you willing to proceed with your visit today? Amazing Shuttleworth has provided verbal consent on 04/23/2022 for a virtual visit (video or telephone). Margaretann Loveless, PA-C  Date: 04/23/2022 2:43 PM  Virtual Visit via Video Note   I, Margaretann Loveless, connected with  Sherry [name]  ([id number], 1986-03-11) on 04/23/22 at  1:45 PM EDT by a video-enabled telemedicine application and verified that I am speaking with the correct person using two identifiers.  Location: Patient: Virtual Visit Location  Patient: Home Provider: Virtual Visit Location Provider: Home Office   I discussed the limitations of evaluation and management by telemedicine and the availability of in person appointments. The patient expressed understanding and agreed to proceed.    History of Present Illness: Sherry [name] is a 36 y.o. who identifies as a female who was assigned female at birth, and is being seen today for questions about iron supplementation. She is on ferrous sulfate. She has 2 questions: 1) she wants to know if she can take the iron supplement with augmentin, and 2) she feels she may be having side effects from the ferrous sulfate. She reports when she takes ferrous sulfate she will get a headache over her temporal area bilaterally. She has also had episodes of chest heaviness and SOB after taking for a few days straight that have caused her to call EMS before.   Problems: There are no problems to display for this patient.   Allergies:  Allergies  Allergen Reactions   Fish Allergy    Shellfish Allergy    Medications:  Current Outpatient Medications:    amoxicillin-clavulanate (AUGMENTIN) 875-125 MG tablet, Take 1 tablet by mouth 2 (two) times daily., Disp: 20 tablet, Rfl: 0  Observations/Objective: Patient is well-developed, well-nourished in no acute distress.  Resting comfortably at home.  Head is normocephalic, atraumatic.  No labored breathing.  Speech is clear and coherent with logical content.  Patient is alert and oriented at baseline.    Assessment and Plan: 1. Medication side effect  2. Iron  deficiency anemia, unspecified iron deficiency anemia type  - Advised okay to take with augmentin but to separate dosing and not take at the same time to avoid GI side effects - Advised to STOP Ferrous sulfate and not take any longer - Change treatment to Ferrous GLYcinate (Slow release iron) to see if tolerates better - Seek in person evaluation with PCP if unable to tolerate oral OTC iron  supplementation  Follow Up Instructions: I discussed the assessment and treatment plan with the patient. The patient was provided an opportunity to ask questions and all were answered. The patient agreed with the plan and demonstrated an understanding of the instructions.  A copy of instructions were sent to the patient via MyChart unless otherwise noted below.    The patient was advised to call back or seek an in-person evaluation if the symptoms worsen or if the condition fails to improve as anticipated.  Time:  I spent 15 minutes with the patient via telehealth technology discussing the above problems/concerns.    Mar Daring, PA-C

## 2022-04-26 ENCOUNTER — Emergency Department (HOSPITAL_BASED_OUTPATIENT_CLINIC_OR_DEPARTMENT_OTHER)
Admission: EM | Admit: 2022-04-26 | Discharge: 2022-04-26 | Disposition: A | Discharge status: Home / Self Care | Payer: Commercial Managed Care - HMO | Attending: Emergency Medicine | Admitting: Emergency Medicine

## 2022-04-26 ENCOUNTER — Other Ambulatory Visit: Payer: Self-pay

## 2022-04-26 ENCOUNTER — Encounter (HOSPITAL_BASED_OUTPATIENT_CLINIC_OR_DEPARTMENT_OTHER): Payer: Self-pay | Admitting: Emergency Medicine

## 2022-04-26 DIAGNOSIS — H6122 Impacted cerumen, left ear: Secondary | ICD-10-CM | POA: Insufficient documentation

## 2022-04-26 DIAGNOSIS — H9202 Otalgia, left ear: Secondary | ICD-10-CM | POA: Diagnosis present

## 2022-04-26 DIAGNOSIS — H61899 Other specified disorders of external ear, unspecified ear: Secondary | ICD-10-CM

## 2022-04-26 NOTE — Discharge Instructions (Signed)
Your history, exam and evaluation are consistent with improving ear infection from the antibiotics you are on however due to manipulation of the ear when it itched you, I do suspect there was a debris impaction limiting your hearing out of the left ear.  Were able to remove a large amount of debris in your hearing started to improve.  We were careful not to cause more injury or inflammation in the ear canal.  Otherwise on your exam we have low suspicion for worsened infection or mastoiditis and agreed together to hold on labs and imaging at this time.  Please follow-up with the ear nose and throat doctor and finish her antibiotics.  If any symptoms are to change or worsen, please return to the nearest emergency department and please consider follow-up with the ENT.  Otherwise please rest and stay hydrated.

## 2022-04-26 NOTE — ED Triage Notes (Addendum)
Arrived by EMS c/o L ear pain and feeling clogged since yesterday. States she cannot hear out of it. States she was recently dx with an ear infection and is currently taking augmentin

## 2022-04-26 NOTE — ED Triage Notes (Signed)
Pt BIB [facility] for ongoing Left side ear pain. Pt taking prescribed ABX as needed. Per EMS pt called 911 this am because she touched it wrong and it caused increase pain   BP 160/110 HR 110 RR 18 99% RA

## 2022-04-26 NOTE — ED Provider Notes (Signed)
[facility] EMERGENCY DEPT Provider Note   CSN: [id number] Arrival date & time: 04/26/22  0704     History  Chief Complaint  Patient presents with   Ear Pain    Sherry [name] is a 36 y.o. female.  The history is provided by the patient and medical records. No language interpreter was used.  Otalgia Location:  Left Behind ear:  No abnormality Quality:  Aching Severity:  Mild (im-proving) Onset quality:  Gradual Duration:  2 weeks Timing:  Constant Progression:  Improving Chronicity:  New Relieved by: augmentin. Worsened by:  Nothing Ineffective treatments:  None tried Associated symptoms: ear discharge (improving) and hearing loss (decreased)   Associated symptoms: no abdominal pain, no congestion (improving), no cough, no diarrhea, no fever, no headaches, no neck pain, no rash, no rhinorrhea, no sore throat (improving), no tinnitus and no vomiting        Home Medications Prior to Admission medications   Medication Sig Start Date End Date Taking? Authorizing Provider  amoxicillin-clavulanate (AUGMENTIN) 875-125 MG tablet Take 1 tablet by mouth 2 (two) times daily. 03/25/22   Waldon Merl, PA-C      Allergies    Fish allergy and Shellfish allergy    Review of Systems   Review of Systems  Constitutional:  Negative for chills, diaphoresis, fatigue and fever.  HENT:  Positive for ear discharge (improving), ear pain and hearing loss (decreased). Negative for congestion (improving), facial swelling, rhinorrhea, sore throat (improving) and tinnitus.   Eyes:  Negative for visual disturbance.  Respiratory:  Negative for cough and shortness of breath.   Cardiovascular:  Negative for chest pain.  Gastrointestinal:  Negative for abdominal pain, constipation, diarrhea, nausea and vomiting.  Genitourinary:  Negative for dysuria.  Musculoskeletal:  Negative for back pain, neck pain and neck stiffness.  Skin:  Negative for rash and wound.  Neurological:   Negative for weakness, light-headedness, numbness and headaches.  Psychiatric/Behavioral:  Negative for agitation.     Physical Exam Updated Vital Signs BP (!) 143/100 (BP Location: Right Arm)   Pulse 95   Temp 98.8 F (37.1 C) (Oral)   Resp 16   Ht 5\' 5"  (1.651 m)   Wt 112.5 kg   LMP 03/17/2022   SpO2 100%   BMI 41.27 kg/m  Physical Exam Vitals and nursing note reviewed.  Constitutional:      General: She is not in acute distress.    Appearance: She is well-developed. She is not ill-appearing, toxic-appearing or diaphoretic.  HENT:     Head: Atraumatic.     Right Ear: Hearing normal. No tenderness. No mastoid tenderness. No hemotympanum. Tympanic membrane is not perforated, erythematous or bulging.     Left Ear: Tenderness present. No mastoid tenderness. No hemotympanum. Tympanic membrane is erythematous. Tympanic membrane is not perforated or bulging.     Ears:     Comments: Cerumen and clumps of drainage in canal of left ear.  Successfully removed and hearing improved.  Minimal tenderness behind left ear but no true mastoid significant tenderness.  No swelling or discrepancy in ear appearance externally. Eyes:     Conjunctiva/sclera: Conjunctivae normal.  Cardiovascular:     Rate and Rhythm: Normal rate and regular rhythm.     Heart sounds: No murmur heard. Pulmonary:     Effort: Pulmonary effort is normal. No respiratory distress.     Breath sounds: Normal breath sounds.  Abdominal:     Palpations: Abdomen is soft.     Tenderness: There  is no abdominal tenderness.  Musculoskeletal:        General: No swelling.     Cervical back: Neck supple.  Skin:    General: Skin is warm and dry.     Capillary Refill: Capillary refill takes less than 2 seconds.  Neurological:     Mental Status: She is alert.  Psychiatric:        Mood and Affect: Mood normal.     ED Results / Procedures / Treatments   Labs (all labs ordered are listed, but only abnormal results are  displayed) Labs Reviewed - No data to display  EKG None  Radiology No results found.  Procedures .Ear Cerumen Removal  Date/Time: 04/26/2022 9:12 AM  Performed by: Courtney Paris, MD Authorized by: Courtney Paris, MD   Consent:    Consent obtained:  Verbal   Consent given by:  Patient   Risks, benefits, and alternatives were discussed: yes     Risks discussed:  Bleeding, infection, incomplete removal and pain   Alternatives discussed:  No treatment Universal protocol:    Patient identity confirmed:  Verbally with patient Procedure details:    Location:  L ear   Procedure type: curette     Procedure outcomes: cerumen removed   Post-procedure details:    Inspection:  TM intact, no bleeding and ear canal clear   Hearing quality:  Improved   Procedure completion:  Tolerated     Medications Ordered in ED Medications - No data to display  ED Course/ Medical Decision Making/ A&P                           Medical Decision Making   Sherry [name] is a 36 y.o. female with a past medical history significant for GERD and currently on Augmentin for left ear infection who presents with decreased hearing after ear manipulation.  According to patient, for the last week and a half she has had symptoms of ear infection and did a telehealth visit and was started on Augmentin.  She reports that the pain, congestion, and overall symptoms have improved however it was itching overnight causing her to rub her ear aggressively and that her hearing decreased.  She reports that overall the pain has not worsened or involved and she is feeling that she is doing much better just is having decreased hearing.  She denies any other trauma and denies any new fevers, chills, congestion, cough, nausea, vomiting, constipation, diarrhea, or other symptoms.  On exam, lungs clear and chest nontender.  Throat nontender.  Mastoid area is not significantly tender.  Ears did not appear bulging  externally.  Pupils are symmetric and reactive normal extraocular movements.  Oropharyngeal exam unremarkable.  Nose exam unremarkable.  Right ear showed some mild cerumen but otherwise was nontender and normal-appearing.  Left ear did have purulent drainage and debris that appeared to clog the canal.  It was removed with a curette, swabs, and careful manipulation.  I was able to see the TM and did not look perforated and otherwise this looks slightly irritated.  Her hearing started to improve after removal of the debris and I suspect that this occluded when she rubbed at her ear earlier.  Had a shared decision-making conversation about management.  Given the improvement in the presenting symptom of hearing changes from the debris I do suspect this is the main problem however we did discuss getting labs or CT imaging if this is  involved to mastoiditis however given her lack of worsened pain or systemic symptoms we agreed to hold on further work-up.  Patient will be given instructions for ENT follow-up and understood return precautions and follow-up instructions.  She will continue on the antibiotics seem to be working and we will not change at this time.  She understands follow-up instructions and was discharged in good condition with improved hearing.        Final Clinical Impression(s) / ED Diagnoses Final diagnoses:  Debris in ear canal    Rx / DC Orders ED Discharge Orders     None       Clinical Impression: 1. Debris in ear canal     Disposition: Discharge  Condition: Good  I have discussed the results, Dx and Tx plan with the pt(& family if present). He/she/they expressed understanding and agree(s) with the plan. Discharge instructions discussed at great length. Strict return precautions discussed and pt &/or family have verbalized understanding of the instructions. No further questions at time of discharge.    New Prescriptions   No medications on file    Follow  Up: Suzanna Obey, MD [address] [place] [address] [place] [address] [phone]   with ENT if symptoms persist     Gwynn Chalker, Canary Brim, MD 04/26/22 [phone]

## 2022-07-02 DIAGNOSIS — I10 Essential (primary) hypertension: Secondary | ICD-10-CM | POA: Insufficient documentation

## 2022-07-21 ENCOUNTER — Ambulatory Visit (INDEPENDENT_AMBULATORY_CARE_PROVIDER_SITE_OTHER): Payer: Medicaid Other | Admitting: Mental Health

## 2022-07-21 ENCOUNTER — Encounter (HOSPITAL_COMMUNITY): Payer: Self-pay

## 2022-07-21 DIAGNOSIS — F322 Major depressive disorder, single episode, severe without psychotic features: Secondary | ICD-10-CM | POA: Diagnosis not present

## 2022-07-21 DIAGNOSIS — F411 Generalized anxiety disorder: Secondary | ICD-10-CM | POA: Insufficient documentation
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
## 2022-07-23 ENCOUNTER — Telehealth: Payer: Self-pay | Admitting: Orthopaedic Surgery

## 2022-07-23 DIAGNOSIS — F331 Major depressive disorder, recurrent, moderate: Secondary | ICD-10-CM | POA: Insufficient documentation

## 2022-07-23 DIAGNOSIS — F419 Anxiety disorder, unspecified: Secondary | ICD-10-CM | POA: Insufficient documentation

## 2022-07-23 NOTE — Telephone Encounter (Signed)
Received call from patient. She has upcoming appointment with another doctor and needs copy of medical records. Verbal Josem Kaufmann accepted. Copy of medical records mailed to patient.

## 2022-07-23 NOTE — Telephone Encounter (Signed)
Patient needs letter for [employer]/Work stating the condition of her leg and her restrictions.

## 2022-07-23 NOTE — Telephone Encounter (Signed)
Pt returned call and was sent to tammy. Pt is needing office notes faxed to [facility]

## 2022-07-27 ENCOUNTER — Ambulatory Visit (INDEPENDENT_AMBULATORY_CARE_PROVIDER_SITE_OTHER): Payer: Medicaid Other | Admitting: Physician Assistant

## 2022-07-27 ENCOUNTER — Encounter (HOSPITAL_COMMUNITY): Payer: Self-pay | Admitting: Physician Assistant

## 2022-07-27 DIAGNOSIS — F322 Major depressive disorder, single episode, severe without psychotic features: Secondary | ICD-10-CM | POA: Diagnosis not present

## 2022-07-27 DIAGNOSIS — G479 Sleep disorder, unspecified: Secondary | ICD-10-CM | POA: Diagnosis not present

## 2022-07-27 DIAGNOSIS — F411 Generalized anxiety disorder: Secondary | ICD-10-CM

## 2022-07-27 MED ORDER — SERTRALINE HCL 50 MG PO TABS: 50.0000 mg | ORAL_TABLET | Freq: Every day | ORAL | 1 refills | Status: DC

## 2022-07-27 MED ORDER — TRAZODONE HCL 50 MG PO TABS: 50.0000 mg | ORAL_TABLET | Freq: Every day | ORAL | 1 refills | Status: DC
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
## 2022-08-17 ENCOUNTER — Telehealth (HOSPITAL_COMMUNITY): Payer: Self-pay | Admitting: Physician Assistant

## 2022-08-17 ENCOUNTER — Other Ambulatory Visit (HOSPITAL_COMMUNITY): Payer: Self-pay | Admitting: Physician Assistant

## 2022-08-17 DIAGNOSIS — G479 Sleep disorder, unspecified: Secondary | ICD-10-CM

## 2022-08-17 DIAGNOSIS — F322 Major depressive disorder, single episode, severe without psychotic features: Secondary | ICD-10-CM

## 2022-08-17 DIAGNOSIS — F411 Generalized anxiety disorder: Secondary | ICD-10-CM

## 2022-08-17 MED ORDER — SERTRALINE HCL 50 MG PO TABS: 50.0000 mg | ORAL_TABLET | Freq: Every day | ORAL | 1 refills | Status: AC

## 2022-08-17 MED ORDER — TRAZODONE HCL 50 MG PO TABS: 50.0000 mg | ORAL_TABLET | Freq: Every day | ORAL | 1 refills | Status: AC

## 2022-08-17 NOTE — Progress Notes (Signed)
Patient presented to this facility stating that her medication was not sent to preferred pharmacy.  Per patient's chart, patient's medications were sent on 07/27/2022.  Provider to rescind patient's medications to pharmacy of choice.

## 2022-08-27 ENCOUNTER — Other Ambulatory Visit: Payer: Self-pay

## 2022-08-27 ENCOUNTER — Ambulatory Visit: Payer: Medicaid Other | Attending: Physician Assistant | Admitting: Physical Therapy

## 2022-08-27 ENCOUNTER — Encounter: Payer: Self-pay | Admitting: Physical Therapy

## 2022-08-27 DIAGNOSIS — G8929 Other chronic pain: Secondary | ICD-10-CM

## 2022-08-27 DIAGNOSIS — M6281 Muscle weakness (generalized): Secondary | ICD-10-CM | POA: Diagnosis present

## 2022-08-27 DIAGNOSIS — M25561 Pain in right knee: Secondary | ICD-10-CM | POA: Diagnosis present

## 2022-08-27 DIAGNOSIS — R262 Difficulty in walking, not elsewhere classified: Secondary | ICD-10-CM | POA: Diagnosis present

## 2022-08-27 DIAGNOSIS — R2689 Other abnormalities of gait and mobility: Secondary | ICD-10-CM | POA: Diagnosis present

## 2022-08-27 DIAGNOSIS — M25661 Stiffness of right knee, not elsewhere classified: Secondary | ICD-10-CM

## 2022-08-27 NOTE — Therapy (Signed)
OUTPATIENT PHYSICAL THERAPY LOWER EXTREMITY EVALUATION   Patient Name: Sherry [name] MRN: [id number] DOB:[birth year], 37 y.o., female Today's Date: 08/27/2022  END OF SESSION:  PT End of Session - 08/27/22 1021     Visit Number 1    Number of Visits 16    Date for PT Re-Evaluation 10/22/22    Authorization Type Medicaid [employer]    PT Start Time [id number]    PT Stop Time 1100    PT Time Calculation (min) 45 min    Activity Tolerance Patient tolerated treatment well    Behavior During Therapy WFL for tasks assessed/performed             Past Medical History:  Diagnosis Date   GERD (gastroesophageal reflux disease)    History reviewed. No pertinent surgical history. Patient Active Problem List   Diagnosis Date Noted   Severe major depression, single episode, without psychotic features ([place]) 07/21/2022   Generalized anxiety disorder 07/21/2022    PCP: none on file  REFERRING PROVIDER: Currence, Gasper Sells, PA-C  REFERRING DIAG:  [phone] (ICD-10-CM) - Pain in right knee  G89.29 (ICD-10-CM) - Other chronic pain  M23.51 (ICD-10-CM) - Chronic instability of knee, right knee    THERAPY DIAG:  Chronic pain of right knee  Stiffness of right knee, not elsewhere classified  Difficulty in walking, not elsewhere classified  Muscle weakness (generalized)  Other abnormalities of gait and mobility  Rationale for Evaluation and Treatment: Rehabilitation  ONSET DATE: 05/06/21 (per my chart)  SUBJECTIVE:   SUBJECTIVE STATEMENT: Pt reports initial fall on her knee in 2022 on grass against a stone. Went to ED and couldn't see anything on x-ray or CT. Pt got an MRI and found that she had partial tear in ACL and other ligaments. She reports she stopped using her R leg for 5 months. Pt got another MRI yesterday. Pt states she hasn't bent her leg in a year. Has not been able to work (in the process of disability). Pt states her leg gets cramped up if she sits too long or standing.  Pt reports limitations in her walking. Pt states she does not want surgery but most recent MRI will decide if she needs surgery. Recently got knee brace 1 week ago.   PERTINENT HISTORY: Partial tear ACL, PCL, ITB, fibular collateral ligament and biceps femoris 05/06/2021  PAIN:  Are you having pain? Yes: NPRS scale: 9 currently, 9 to 10 at worst /10 Pain location: top of knee Pain description: cramp, dull, achy, stiff Aggravating factors: Standing, walking, weight bearing Relieving factors: pain medicine, ice,   PRECAUTIONS: None  WEIGHT BEARING RESTRICTIONS: No  FALLS:  Has patient fallen in last 6 months? No  LIVING ENVIRONMENT: Lives with: lives alone friend comes to help Lives in: House/apartment Stairs: No Has following equipment at home:  knee brace, grab bar  OCCUPATION: Going through disability  PLOF: Independent with basic ADLs  PATIENT GOALS: Wants to be able to bend her knee. Wants to be able to stand and stabilize on her leg.   NEXT MD VISIT: not yet scheduled  OBJECTIVE:   DIAGNOSTIC FINDINGS: Knee MRI 05/22/21 IMPRESSION: 1. Complete avulsion of the fibular collateral ligament and biceps femora. Tendon from the fibular head. 2. Partial tear of the IT band just proximal to its tibia insertion. 3. Partial tears of the ACL and PCL. 4. Avulsion fracture of the tip of the fibular head. 5. Severe popliteus muscle injury with partial tear of the myotendinous junction. 6.  Moderate joint effusion.  PATIENT SURVEYS:  LEFS 0%  COGNITION: Overall cognitive status: Within functional limits for tasks assessed     SENSATION: WFL  EDEMA:  Sometimes  MUSCLE LENGTH: Hamstrings: Right 50 deg; Left 90 deg Thomas test: Right -10 deg; Left 0 deg  POSTURE: increased lumbar lordosis and weight shift left  PALPATION: TTP hamstring and quad on R  LOWER EXTREMITY ROM:  Active ROM Right eval Left eval  Hip flexion Limited due to hamstring   Hip extension -10  (supine)   Hip abduction    Hip adduction    Hip internal rotation    Hip external rotation    Knee flexion 55 (in supine)   Knee extension -15 (in supine)   Ankle dorsiflexion    Ankle plantarflexion    Ankle inversion    Ankle eversion     (Blank rows = not tested)  LOWER EXTREMITY MMT:  MMT Right eval Left eval  Hip flexion 4- 5  Hip extension 4- 5  Hip abduction 3 5  Hip adduction    Hip internal rotation    Hip external rotation    Knee flexion 3- (limited by ROM) 5  Knee extension 3- (limited by ROM 5  Ankle dorsiflexion    Ankle plantarflexion    Ankle inversion    Ankle eversion     (Blank rows = not tested)  LOWER EXTREMITY SPECIAL TESTS:  Knee special tests: Anterior drawer test: negative and Posterior drawer test: negative. No pain noted with testing; however, laxity is notable  FUNCTIONAL TESTS:  Sit<>stand: Maintains R knee extended, full weight on L. Hips flexed on R.   GAIT: Distance walked: 150' Assistive device utilized:  Donjoy knee brace Level of assistance: Complete Independence Comments: Antalgic, R LE in knee brace, hip flexed, knee maintained in extension   TODAY'S TREATMENT:                                                                                                                              DATE: 08/27/22  See HEP below   PATIENT EDUCATION:  Education details: Exam findings, POC, initial HEP Person educated: Patient Education method: Explanation, Demonstration, and Handouts Education comprehension: verbalized understanding, returned demonstration, and needs further education  HOME EXERCISE PROGRAM: Access Code: [email or web address] URL: [email or web address] Date: 08/27/2022 Prepared by: Estill Bamberg April Thurnell Garbe  Exercises - Long Sitting Quad Set  - 1 x daily - 7 x weekly - 2 sets - 10 reps - 3 sec hold - Modified Thomas Stretch  - 1 x daily - 7 x weekly - 2 sets - 1 min hold - Long Sitting Calf Stretch with Strap  - 1  x daily - 7 x weekly - 2 sets - 1 min hold - Sidelying Hip Abduction  - 1 x daily - 7 x weekly - 2 sets - 10 reps - Prone Quadriceps Stretch with Strap  - 1 x daily - 7 x  weekly - 2 sets - 1 min hold - Prone Knee Flexion  - 1 x daily - 7 x weekly - 2 sets - 10 reps - Seated Hamstring Stretch  - 1 x daily - 7 x weekly - 2 sets - 1 min hold  ASSESSMENT:  CLINICAL IMPRESSION: Patient is a 37 y.o. F who was seen today for physical therapy evaluation and treatment for R knee stiffness and pain. Pt had initial knee injury on Nov 2022 but deferred surgery. Remained NWB for 5 months after and kept knee extended. Assessment significant for decreased R knee ROM, R hip tightness, and decreased R LE strength leading to pain, antalgic gait, decreased tolerance to all mobility and instability affecting home and community tasks. Pt would benefit from PT to address these deficits.   OBJECTIVE IMPAIRMENTS: Abnormal gait, decreased activity tolerance, decreased balance, decreased endurance, decreased knowledge of condition, decreased knowledge of use of DME, decreased mobility, difficulty walking, decreased ROM, decreased strength, hypomobility, increased fascial restrictions, increased muscle spasms, impaired flexibility, improper body mechanics, and pain.   ACTIVITY LIMITATIONS: carrying, lifting, bending, sitting, standing, squatting, sleeping, stairs, transfers, bed mobility, bathing, toileting, dressing, hygiene/grooming, locomotion level, and caring for others  PARTICIPATION LIMITATIONS: cleaning, laundry, shopping, community activity, occupation, and yard work  PERSONAL FACTORS: Age, Fitness, Past/current experiences, and Time since onset of injury/illness/exacerbation are also affecting patient's functional outcome.   REHAB POTENTIAL: Fair Has kept leg extended since Nov 2022 with multiple knee ligament tears  CLINICAL DECISION MAKING: Evolving/moderate complexity  EVALUATION COMPLEXITY:  Moderate   GOALS: Goals reviewed with patient? Yes  SHORT TERM GOALS: Target date: 09/24/2022   Pt will be ind with initial HEP Baseline: Goal status: INITIAL  2.  Pt will be able to improve knee ROM to 0 - 75 deg Baseline:  Goal status: INITIAL  3.  Pt will be able to demo SLR > 60 deg to demo improving muscle extensibility Baseline:  Goal status: INITIAL   LONG TERM GOALS: Target date: 10/22/2022   Pt will be ind with progression and maintenance of HEP Baseline:  Goal status: INITIAL  2.  Pt will improve knee ROM to >/= 0 - 90 deg to be able to don/doff her shoes Baseline:  Goal status: INITIAL  3.  Pt will be able to perform normal sit<>stand without UE assist to demo increased functional LE strength Baseline:  Goal status: INITIAL  4.  Pt will be able to amb x10 min with normal reciprocal gait Baseline:  Goal status: INITIAL  5.  Pt will have improved LEFs by >/=10 points to demo MCID Baseline:  Goal status: INITIAL     PLAN:  PT FREQUENCY: 2x/week  PT DURATION: 8 weeks  PLANNED INTERVENTIONS: Therapeutic exercises, Therapeutic activity, Neuromuscular re-education, Balance training, Gait training, Patient/Family education, Self Care, Joint mobilization, Stair training, Aquatic Therapy, Dry Needling, Electrical stimulation, Cryotherapy, Moist heat, Taping, Vasopneumatic device, Ionotophoresis 52m/ml Dexamethasone, Manual therapy, and Re-evaluation  PLAN FOR NEXT SESSION: Assess response to HEP. Continue to stretch/strengthen knees/hips. Manual work as indicated. Consider TPDN hamstring/quads (pt will need education). Work on standing tolerance -- consider aquatics.    Winnell Bento April Ma L Khayree Delellis, PT 08/27/2022, 1:18 PM   Check all possible CPT codes: [id number]- PT Re-evaluation, 97110- Therapeutic Exercise, [phone] Neuro Re-education, [phone]- Gait Training, [id number]- Manual Therapy, 97530 - Therapeutic Activities, 97535 - Self Care, [phone]- Electrical stimulation  (unattended), [id number]- Iontophoresis, 97016 - Vaso, and [id number]- Aquatic therapy  Check all conditions that are expected to impact treatment: Musculoskeletal disorders   If treatment provided at initial evaluation, no treatment charged due to lack of authorization.

## 2022-09-07 NOTE — Therapy (Signed)
OUTPATIENT PHYSICAL THERAPY TREATMENT NOTE   Patient Name: Sherry [name] MRN: [id number] DOB:January 25, 1986, 37 y.o., female Today's Date: 09/08/2022  PCP: none on file  REFERRING PROVIDER: Currence, Vladimir Crofts, PA-C   END OF SESSION:   PT End of Session - 09/08/22 1039     Visit Number 2    Number of Visits 16    Date for PT Re-Evaluation 10/22/22    Authorization Type Medicaid [employer]    Authorization Time Period 5 visits approved 09/08/22-11/06/22    Authorization - Visit Number 1    Authorization - Number of Visits 5    PT Start Time 1045    PT Stop Time 1125    PT Time Calculation (min) 40 min    Activity Tolerance Patient tolerated treatment well    Behavior During Therapy WFL for tasks assessed/performed             Past Medical History:  Diagnosis Date   GERD (gastroesophageal reflux disease)    History reviewed. No pertinent surgical history. Patient Active Problem List   Diagnosis Date Noted   Severe major depression, single episode, without psychotic features (HCC) 07/21/2022   Generalized anxiety disorder 07/21/2022    REFERRING DIAG:  M25.561 (ICD-10-CM) - Pain in right knee  G89.29 (ICD-10-CM) - Other chronic pain  M23.51 (ICD-10-CM) - Chronic instability of knee, right knee    THERAPY DIAG:  Chronic pain of right knee  Stiffness of right knee, not elsewhere classified  Difficulty in walking, not elsewhere classified  Muscle weakness (generalized)  Other abnormalities of gait and mobility  Rationale for Evaluation and Treatment Rehabilitation  PERTINENT HISTORY: Partial tear ACL, PCL, ITB, fibular collateral ligament and biceps femoris 05/06/2021   PRECAUTIONS: None  SUBJECTIVE:                                                                                                                                                                                      SUBJECTIVE STATEMENT:  Patient reports continued pain, HEP compliance.    PAIN:   Are you having pain? Yes: NPRS scale: 7 currently, 9 to 10 at worst /10 Pain location: top of knee Pain description: cramp, dull, achy, stiff Aggravating factors: Standing, walking, weight bearing Relieving factors: pain medicine, ice,    OBJECTIVE: (objective measures completed at initial evaluation unless otherwise dated)   DIAGNOSTIC FINDINGS: Knee MRI 05/22/21 IMPRESSION: 1. Complete avulsion of the fibular collateral ligament and biceps femora. Tendon from the fibular head. 2. Partial tear of the IT band just proximal to its tibia insertion. 3. Partial tears of the ACL and PCL. 4. Avulsion fracture of the tip of the fibular  head. 5. Severe popliteus muscle injury with partial tear of the myotendinous junction. 6. Moderate joint effusion.   PATIENT SURVEYS:  LEFS 0%   COGNITION: Overall cognitive status: Within functional limits for tasks assessed                         SENSATION: WFL   EDEMA:  Sometimes   MUSCLE LENGTH: Hamstrings: Right 50 deg; Left 90 deg Thomas test: Right -10 deg; Left 0 deg   POSTURE: increased lumbar lordosis and weight shift left   PALPATION: TTP hamstring and quad on R   LOWER EXTREMITY ROM:   Active ROM Right eval Left eval  Hip flexion Limited due to hamstring    Hip extension -10 (supine)    Hip abduction      Hip adduction      Hip internal rotation      Hip external rotation      Knee flexion 55 (in supine)    Knee extension -15 (in supine)    Ankle dorsiflexion      Ankle plantarflexion      Ankle inversion      Ankle eversion       (Blank rows = not tested)   LOWER EXTREMITY MMT:   MMT Right eval Left eval  Hip flexion 4- 5  Hip extension 4- 5  Hip abduction 3 5  Hip adduction      Hip internal rotation      Hip external rotation      Knee flexion 3- (limited by ROM) 5  Knee extension 3- (limited by ROM 5  Ankle dorsiflexion      Ankle plantarflexion      Ankle inversion      Ankle eversion        (Blank rows = not tested)   LOWER EXTREMITY SPECIAL TESTS:  Knee special tests: Anterior drawer test: negative and Posterior drawer test: negative. No pain noted with testing; however, laxity is notable   FUNCTIONAL TESTS:  Sit<>stand: Maintains R knee extended, full weight on L. Hips flexed on R.    GAIT: Distance walked: 150' Assistive device utilized:  Donjoy knee brace Level of assistance: Complete Independence Comments: Antalgic, R LE in knee brace, hip flexed, knee maintained in extension     TODAY'S TREATMENT:                          [facility] Adult PT Treatment:                                                DATE: 09/08/22 Therapeutic Exercise: Supine QS 5" hold 2x10 Rt BFR performed by certified therapist April Nonato; 50% 144 mmHg:  QS, SAQ; 30, 15, 15, 15 SLR; 17, 15, 7, 12  DATE: 08/27/22  See HEP below     PATIENT EDUCATION:  Education details: Exam findings, POC, initial HEP Person educated: Patient Education method: Explanation, Demonstration, and Handouts Education comprehension: verbalized understanding, returned demonstration, and needs further education   HOME EXERCISE PROGRAM: Access Code: [email or web address] URL: [email or web address] Date: 08/27/2022 Prepared by: Vernon Prey April Kirstie Peri   Exercises - Long Sitting Quad Set  - 1 x daily - 7 x weekly - 2 sets - 10 reps - 3 sec hold - Modified Thomas Stretch  - 1 x daily - 7 x weekly - 2 sets - 1 min hold - Long Sitting Calf Stretch with Strap  - 1 x daily - 7 x weekly - 2 sets - 1 min hold - Sidelying Hip Abduction  - 1 x daily - 7 x weekly - 2 sets - 10 reps - Prone Quadriceps Stretch with Strap  - 1 x daily - 7 x weekly - 2 sets - 1 min hold - Prone Knee Flexion  - 1 x daily - 7 x weekly - 2 sets - 10 reps - Seated Hamstring Stretch  - 1 x daily - 7 x weekly - 2 sets - 1 min hold   ASSESSMENT:    CLINICAL IMPRESSION: Patient presents to PT with continued knee pain and reports HEP compliance. BFR performed by certified therapist April Nonato, DPT for quadriceps strengthening. Most difficult exercise was SLR. Patient was able to tolerate all prescribed exercises with no adverse effects. Patient continues to benefit from skilled PT services and should be progressed as able to improve functional independence.      OBJECTIVE IMPAIRMENTS: Abnormal gait, decreased activity tolerance, decreased balance, decreased endurance, decreased knowledge of condition, decreased knowledge of use of DME, decreased mobility, difficulty walking, decreased ROM, decreased strength, hypomobility, increased fascial restrictions, increased muscle spasms, impaired flexibility, improper body mechanics, and pain.    ACTIVITY LIMITATIONS: carrying, lifting, bending, sitting, standing, squatting, sleeping, stairs, transfers, bed mobility, bathing, toileting, dressing, hygiene/grooming, locomotion level, and caring for others   PARTICIPATION LIMITATIONS: cleaning, laundry, shopping, community activity, occupation, and yard work   PERSONAL FACTORS: Age, Fitness, Past/current experiences, and Time since onset of injury/illness/exacerbation are also affecting patient's functional outcome.    REHAB POTENTIAL: Fair Has kept leg extended since Nov 2022 with multiple knee ligament tears   CLINICAL DECISION MAKING: Evolving/moderate complexity   EVALUATION COMPLEXITY: Moderate     GOALS: Goals reviewed with patient? Yes   SHORT TERM GOALS: Target date: 09/24/2022     Pt will be ind with initial HEP Baseline: Goal status: INITIAL   2.  Pt will be able to improve knee ROM to 0 - 75 deg Baseline:  Goal status: INITIAL   3.  Pt will be able to demo SLR > 60 deg to demo improving muscle extensibility Baseline:  Goal status: INITIAL     LONG TERM GOALS: Target date: 10/22/2022     Pt will be ind with progression  and maintenance of HEP Baseline:  Goal status: INITIAL   2.  Pt will improve knee ROM to >/= 0 - 90 deg to be able to don/doff her shoes Baseline:  Goal status: INITIAL   3.  Pt will be able to perform normal sit<>stand without UE assist to demo increased functional LE strength Baseline:  Goal status: INITIAL   4.  Pt will be able to amb x10 min with normal reciprocal gait Baseline:  Goal status: INITIAL   5.  Pt will have  improved LEFs by >/=10 points to demo MCID Baseline:  Goal status: INITIAL         PLAN:   PT FREQUENCY: 2x/week   PT DURATION: 8 weeks   PLANNED INTERVENTIONS: Therapeutic exercises, Therapeutic activity, Neuromuscular re-education, Balance training, Gait training, Patient/Family education, Self Care, Joint mobilization, Stair training, Aquatic Therapy, Dry Needling, Electrical stimulation, Cryotherapy, Moist heat, Taping, Vasopneumatic device, Ionotophoresis 4mg /ml Dexamethasone, Manual therapy, and Re-evaluation   PLAN FOR NEXT SESSION: Assess response to HEP. Continue to stretch/strengthen knees/hips. Manual work as indicated. Consider TPDN hamstring/quads (pt will need education). Work on standing tolerance -- consider aquatics.    Berta Minor, PTA 09/08/2022, 11:26 AM

## 2022-09-08 ENCOUNTER — Ambulatory Visit: Payer: Medicaid Other

## 2022-09-08 DIAGNOSIS — G8929 Other chronic pain: Secondary | ICD-10-CM

## 2022-09-08 DIAGNOSIS — M25661 Stiffness of right knee, not elsewhere classified: Secondary | ICD-10-CM

## 2022-09-08 DIAGNOSIS — R262 Difficulty in walking, not elsewhere classified: Secondary | ICD-10-CM

## 2022-09-08 DIAGNOSIS — R2689 Other abnormalities of gait and mobility: Secondary | ICD-10-CM

## 2022-09-08 DIAGNOSIS — M25561 Pain in right knee: Secondary | ICD-10-CM | POA: Diagnosis not present

## 2022-09-08 DIAGNOSIS — M6281 Muscle weakness (generalized): Secondary | ICD-10-CM

## 2022-09-10 ENCOUNTER — Ambulatory Visit: Payer: Medicaid Other | Admitting: Physical Therapy

## 2022-09-10 ENCOUNTER — Encounter (HOSPITAL_COMMUNITY): Payer: Medicaid Other | Admitting: Physician Assistant

## 2022-09-15 ENCOUNTER — Ambulatory Visit: Payer: Medicaid Other

## 2022-09-15 ENCOUNTER — Emergency Department (HOSPITAL_BASED_OUTPATIENT_CLINIC_OR_DEPARTMENT_OTHER)
Admission: EM | Admit: 2022-09-15 | Discharge: 2022-09-15 | Disposition: A | Discharge status: Home / Self Care | Payer: Medicaid Other | Attending: Emergency Medicine | Admitting: Emergency Medicine

## 2022-09-15 ENCOUNTER — Other Ambulatory Visit: Payer: Self-pay

## 2022-09-15 ENCOUNTER — Encounter (HOSPITAL_BASED_OUTPATIENT_CLINIC_OR_DEPARTMENT_OTHER): Payer: Self-pay

## 2022-09-15 DIAGNOSIS — I10 Essential (primary) hypertension: Secondary | ICD-10-CM | POA: Diagnosis present

## 2022-09-15 NOTE — ED Triage Notes (Signed)
Patient here POV from Home.  Was at Dentist today and instructed to seek ED Evaluation for BP of 0000000 Systolic. No Symptoms besides Right Eye Twitching. No SOB or CP.   History of Treated HTN. Has not taken HCTZ over past two days.  NAD Noted during Triage. A&Ox4. GCS 15. Ambulatory.

## 2022-09-15 NOTE — ED Provider Notes (Signed)
[facility] Provider Note   CSN: [id number] Arrival date & time: 09/15/22  1250     History  Chief Complaint  Patient presents with   Hypertension    Sherry [name] is a 37 y.o. female.   Hypertension  Patient has history of hypertension.  Was at dentist today to get teeth cleaning and was told to come to the ER because her blood pressure was 160/100.  No complaints.  However does have a history of anxiety and is much more nervous now after being told that she may have a stroke if she did not get here quickly.  States she will occasionally get a twitching in her right eye.  No headache.  No confusion.  No chest pain.  Has a history of hypertension and is on 25 mg of hydrochlorothiazide.  Has not had it for the last 2 to 3 days.    Past Medical History:  Diagnosis Date   GERD (gastroesophageal reflux disease)     Home Medications Prior to Admission medications   Medication Sig Start Date End Date Taking? Authorizing Provider  amoxicillin-clavulanate (AUGMENTIN) 875-125 MG tablet Take 1 tablet by mouth 2 (two) times daily. 03/25/22   Brunetta Jeans, PA-C  hydrochlorothiazide (HYDRODIURIL) 25 MG tablet Take 25 mg by mouth daily.    [provider]  naproxen (NAPROSYN) 500 MG tablet Take 500 mg by mouth 2 (two) times daily with a meal.    [provider]  sertraline (ZOLOFT) 50 MG tablet Take 1 tablet (50 mg total) by mouth daily. 08/17/22 08/17/23  Malachy Mood, PA  traZODone (DESYREL) 50 MG tablet Take 1 tablet (50 mg total) by mouth at bedtime. 08/17/22   Malachy Mood, PA      Allergies    Fish allergy and Shellfish allergy    Review of Systems   Review of Systems  Physical Exam Updated Vital Signs BP (!) 141/96 (BP Location: Left Arm)   Pulse 96   Temp 97.9 F (36.6 C) (Oral)   Resp 18   Ht '5\' 5"'$  (1.651 m)   Wt 112.5 kg   SpO2 100%   BMI 41.27 kg/m  Physical Exam Vitals and nursing note reviewed.   Cardiovascular:     Rate and Rhythm: Regular rhythm.  Pulmonary:     Breath sounds: No wheezing.  Abdominal:     Tenderness: There is no abdominal tenderness.  Musculoskeletal:        General: No tenderness.  Neurological:     Mental Status: She is alert.     ED Results / Procedures / Treatments   Labs (all labs ordered are listed, but only abnormal results are displayed) Labs Reviewed - No data to display  EKG None  Radiology No results found.  Procedures Procedures    Medications Ordered in ED Medications - No data to display  ED Course/ Medical Decision Making/ A&P                             Medical Decision Making  Patient without complaints but sent in for hypertension.  Blood pressure reportedly was 160/102.  Here is 140/96.  No chest pain.  No trouble breathing no numbness weakness.  Has been noncompliant with her medicines.  Do not think there is any acute endorgan damage and has been off her medicines for the last few days.  Will discharge home.  Follow-up with  PCP.  I reviewed PCP note.  Discussed warning signs for blood pressure.  May get a blood pressure cuff and will check it at home and keep a log.  Appears stable for discharge home however.        Final Clinical Impression(s) / ED Diagnoses Final diagnoses:  Hypertension, unspecified type    Rx / DC Orders ED Discharge Orders     None         Davonna Belling, MD 09/15/22 1317

## 2022-09-15 NOTE — Discharge Instructions (Signed)
You can take your blood pressure once a day and record it but try not to worry about it.  Be sure you take your blood pressure medicine daily.  Follow-up with your doctor.

## 2022-09-15 NOTE — ED Notes (Signed)
Pt d/c home per MD order. Discharge summary reviewed, pt verbalizes understanding. Ambulatory off unit. No s/s of acute distress noted at discharge.  °

## 2022-09-16 ENCOUNTER — Ambulatory Visit (HOSPITAL_COMMUNITY): Payer: Medicaid Other | Admitting: Mental Health

## 2022-09-16 ENCOUNTER — Ambulatory Visit (INDEPENDENT_AMBULATORY_CARE_PROVIDER_SITE_OTHER): Payer: Medicaid Other | Admitting: Mental Health

## 2022-09-16 ENCOUNTER — Telehealth: Payer: Self-pay | Admitting: Physical Therapy

## 2022-09-16 DIAGNOSIS — F411 Generalized anxiety disorder: Secondary | ICD-10-CM

## 2022-09-16 DIAGNOSIS — F322 Major depressive disorder, single episode, severe without psychotic features: Secondary | ICD-10-CM

## 2022-09-16 NOTE — Progress Notes (Signed)
   THERAPIST PROGRESS NOTE  Session Time: 3:30 pm ( 54 minutes)  Participation Level: Active  Behavioral Response: CasualAlertWNL  Type of Therapy: Individual Therapy  Treatment Goals addressed: STG: Sherry [name] will decreased severity of depression sxs AEB development of x 3 effective coping skills with ability to engage in enjoyable activities weekly within the next 90 days.   STG: Sherry [name] will increase management of anxiety AEB development of x 3 grounding skills with ability to reframe anxious and depressive thoughts daily as needed within the next 90 days.   ProgressTowards Goals: Initial  Interventions: CBT and Supportive  Summary: Sherry [name] is a 37 y.o. female who presents with chief complaint of ongoing anxiety with dx of major depression and generalized anxiety disorder. [name] presents for session alert and oriented; mood and affect adequate; neutral. Speech clear and coherent at normal rate and tone. Engaged and receptive to interventions. Sherry [name] shares for moods to have been unchanged and notes to not be medication compliant with concern to have started high blood pressure medication and worries of drug interactions between the two. Shares ongoing anxiety and difficulty sleeping as a result. Shares increase stress with injury to knee now in physical therapy and working to gain disability with not being able to work at this time. Able to engage with therapist with education on anxiety and working to develop use of coping skills. Engaged and practices deep breathing exercises in session and receptive of handout on skill. Agrees to practice. Sxs unchanged at this time, no progress with goals at this time.  No SI/HI  Suicidal/Homicidal: Nowithout intent/plan  Therapist Response: Therapist engaged Designer, television/film set in therapy session. Completed check in and assessed for current level of functioning, sxs management and level of stressors. Reviewed informed consent; bounds off confidentiality.  Reviewed assessment information and assessed for stressors. Provided safe place for Sherry [name] to share thoughts and feelings related to concerns; provided supported feedback and validated feelings. Engaged pt in education on anxiety and connectin of thoughts, feelings and behaviors. Assessed for use of coping skills. Lead through deep breathing exercises and completed rounds of box breathing and 478 breathing. Encouraged Sherry [name] to practice deep breathing x 3 daily and encouraged medication compliance and discussing with providers concerns for interactions. Reviewed session, provided follow up. Assessed for safety.   Plan: Return again in  x 4  weeks.  Diagnosis: Severe major depression, single episode, without psychotic features ([place])  Generalized anxiety disorder  Collaboration of Care: Other None   Patient/Guardian was advised Release of Information must be obtained prior to any record release in order to collaborate their care with an outside provider. Patient/Guardian was advised if they have not already done so to contact the registration department to sign all necessary forms in order for [place] to release information regarding their care.   Consent: Patient/Guardian gives verbal consent for treatment and assignment of benefits for services provided during this visit. Patient/Guardian expressed understanding and agreed to proceed.   Rockey Situ [place], [facility] 09/16/2022

## 2022-09-16 NOTE — Telephone Encounter (Signed)
Called to inform patient we are closed 3/29 due to [place] holiday, we have some openings w [facility] on Tuesday and Thursday that week, patient already has an appt that wedsnesday. LVM for her to call us back.

## 2022-09-17 ENCOUNTER — Ambulatory Visit: Payer: Medicaid Other | Attending: Physician Assistant | Admitting: Physical Therapy

## 2022-09-17 ENCOUNTER — Encounter: Payer: Self-pay | Admitting: Physical Therapy

## 2022-09-17 DIAGNOSIS — R2689 Other abnormalities of gait and mobility: Secondary | ICD-10-CM | POA: Insufficient documentation

## 2022-09-17 DIAGNOSIS — M25561 Pain in right knee: Secondary | ICD-10-CM | POA: Insufficient documentation

## 2022-09-17 DIAGNOSIS — M6281 Muscle weakness (generalized): Secondary | ICD-10-CM | POA: Diagnosis present

## 2022-09-17 DIAGNOSIS — G8929 Other chronic pain: Secondary | ICD-10-CM | POA: Diagnosis present

## 2022-09-17 DIAGNOSIS — M25661 Stiffness of right knee, not elsewhere classified: Secondary | ICD-10-CM | POA: Insufficient documentation

## 2022-09-17 DIAGNOSIS — R262 Difficulty in walking, not elsewhere classified: Secondary | ICD-10-CM | POA: Insufficient documentation

## 2022-09-17 NOTE — Therapy (Signed)
OUTPATIENT PHYSICAL THERAPY TREATMENT NOTE   Patient Name: Sherry [name] MRN: [id number] DOB:04-04-1986, 37 y.o., female Today's Date: 09/17/2022  PCP: none on file  REFERRING PROVIDER: Currence, Gasper Sells, PA-C   END OF SESSION:   PT End of Session - 09/17/22 1001     Visit Number 3    Number of Visits 16    Date for PT Re-Evaluation 10/22/22    Authorization Type Medicaid [employer]    Authorization Time Period 5 visits approved 09/08/22-11/06/22    Authorization - Visit Number 2    Authorization - Number of Visits 5    PT Start Time 1000    PT Stop Time 1041    PT Time Calculation (min) 41 min    Activity Tolerance Patient tolerated treatment well    Behavior During Therapy WFL for tasks assessed/performed             Past Medical History:  Diagnosis Date   GERD (gastroesophageal reflux disease)    History reviewed. No pertinent surgical history. Patient Active Problem List   Diagnosis Date Noted   Severe major depression, single episode, without psychotic features ([place]) 07/21/2022   Generalized anxiety disorder 07/21/2022    REFERRING DIAG:  M25.561 (ICD-10-CM) - Pain in right knee  G89.29 (ICD-10-CM) - Other chronic pain  M23.51 (ICD-10-CM) - Chronic instability of knee, right knee    THERAPY DIAG:  Chronic pain of right knee  Stiffness of right knee, not elsewhere classified  Difficulty in walking, not elsewhere classified  Rationale for Evaluation and Treatment Rehabilitation  PERTINENT HISTORY: Partial tear ACL, PCL, ITB, fibular collateral ligament and biceps femoris 05/06/2021   PRECAUTIONS: None  SUBJECTIVE:                                                                                                                                                                                      SUBJECTIVE STATEMENT:  Pt reports that she feels last session was helpful.   PAIN:  Are you having pain? Yes: NPRS scale: 8 currently, 9 to 10 at worst  /10 Pain location: top of knee Pain description: cramp, dull, achy, stiff Aggravating factors: Standing, walking, weight bearing Relieving factors: pain medicine, ice,    OBJECTIVE: (objective measures completed at initial evaluation unless otherwise dated)   DIAGNOSTIC FINDINGS: Knee MRI 05/22/21 IMPRESSION: 1. Complete avulsion of the fibular collateral ligament and biceps femora. Tendon from the fibular head. 2. Partial tear of the IT band just proximal to its tibia insertion. 3. Partial tears of the ACL and PCL. 4. Avulsion fracture of the tip of the fibular head. 5. Severe popliteus muscle injury with partial tear  of the myotendinous junction. 6. Moderate joint effusion.   PATIENT SURVEYS:  LEFS 0%   COGNITION: Overall cognitive status: Within functional limits for tasks assessed                         SENSATION: WFL   EDEMA:  Sometimes   MUSCLE LENGTH: Hamstrings: Right 50 deg; Left 90 deg Thomas test: Right -10 deg; Left 0 deg   POSTURE: increased lumbar lordosis and weight shift left   PALPATION: TTP hamstring and quad on R   LOWER EXTREMITY ROM:   Active ROM Right eval Left eval  Hip flexion Limited due to hamstring    Hip extension -10 (supine)    Hip abduction      Hip adduction      Hip internal rotation      Hip external rotation      Knee flexion 55 (in supine)    Knee extension -15 (in supine)    Ankle dorsiflexion      Ankle plantarflexion      Ankle inversion      Ankle eversion       (Blank rows = not tested)   LOWER EXTREMITY MMT:   MMT Right eval Left eval  Hip flexion 4- 5  Hip extension 4- 5  Hip abduction 3 5  Hip adduction      Hip internal rotation      Hip external rotation      Knee flexion 3- (limited by ROM) 5  Knee extension 3- (limited by ROM 5  Ankle dorsiflexion      Ankle plantarflexion      Ankle inversion      Ankle eversion       (Blank rows = not tested)   LOWER EXTREMITY SPECIAL TESTS:  Knee  special tests: Anterior drawer test: negative and Posterior drawer test: negative. No pain noted with testing; however, laxity is notable   FUNCTIONAL TESTS:  Sit<>stand: Maintains R knee extended, full weight on L. Hips flexed on R.    GAIT: Distance walked: 150' Assistive device utilized:  Donjoy knee brace Level of assistance: Complete Independence Comments: Antalgic, R LE in knee brace, hip flexed, knee maintained in extension     TODAY'S TREATMENT:                          [facility] Adult PT Treatment:                                                DATE: 09/17/22 Therapeutic Exercise: Supine QS 5" hold 2x10 Rt BFR: 70% occlusion 203 mmHg:  LAQ, SAQ; 30, 15, 15, 15 SLR; 10, 10, 10, 6 BP: 120/80                                                                                       DATE: 08/27/22  See HEP below     PATIENT EDUCATION:  Education details: Exam findings,  POC, initial HEP Person educated: Patient Education method: Explanation, Demonstration, and Handouts Education comprehension: verbalized understanding, returned demonstration, and needs further education   HOME EXERCISE PROGRAM: Access Code: [email or web address] URL: [email or web address] Date: 08/27/2022 Prepared by: Estill Bamberg April Thurnell Garbe   Exercises - Long Sitting Quad Set  - 1 x daily - 7 x weekly - 2 sets - 10 reps - 3 sec hold - Modified Thomas Stretch  - 1 x daily - 7 x weekly - 2 sets - 1 min hold - Long Sitting Calf Stretch with Strap  - 1 x daily - 7 x weekly - 2 sets - 1 min hold - Sidelying Hip Abduction  - 1 x daily - 7 x weekly - 2 sets - 10 reps - Prone Quadriceps Stretch with Strap  - 1 x daily - 7 x weekly - 2 sets - 1 min hold - Prone Knee Flexion  - 1 x daily - 7 x weekly - 2 sets - 10 reps - Seated Hamstring Stretch  - 1 x daily - 7 x weekly - 2 sets - 1 min hold   ASSESSMENT:   CLINICAL IMPRESSION: Earlyne tolerated session well with no adverse reaction.  Continued with BFR to good  effect.  Able to add in [place].     OBJECTIVE IMPAIRMENTS: Abnormal gait, decreased activity tolerance, decreased balance, decreased endurance, decreased knowledge of condition, decreased knowledge of use of DME, decreased mobility, difficulty walking, decreased ROM, decreased strength, hypomobility, increased fascial restrictions, increased muscle spasms, impaired flexibility, improper body mechanics, and pain.    ACTIVITY LIMITATIONS: carrying, lifting, bending, sitting, standing, squatting, sleeping, stairs, transfers, bed mobility, bathing, toileting, dressing, hygiene/grooming, locomotion level, and caring for others   PARTICIPATION LIMITATIONS: cleaning, laundry, shopping, community activity, occupation, and yard work   PERSONAL FACTORS: Age, Fitness, Past/current experiences, and Time since onset of injury/illness/exacerbation are also affecting patient's functional outcome.    REHAB POTENTIAL: Fair Has kept leg extended since Nov 2022 with multiple knee ligament tears   CLINICAL DECISION MAKING: Evolving/moderate complexity   EVALUATION COMPLEXITY: Moderate     GOALS: Goals reviewed with patient? Yes   SHORT TERM GOALS: Target date: 09/24/2022     Pt will be ind with initial HEP Baseline: Goal status: INITIAL   2.  Pt will be able to improve knee ROM to 0 - 75 deg Baseline:  Goal status: INITIAL   3.  Pt will be able to demo SLR > 60 deg to demo improving muscle extensibility Baseline:  Goal status: INITIAL     LONG TERM GOALS: Target date: 10/22/2022     Pt will be ind with progression and maintenance of HEP Baseline:  Goal status: INITIAL   2.  Pt will improve knee ROM to >/= 0 - 90 deg to be able to don/doff her shoes Baseline:  Goal status: INITIAL   3.  Pt will be able to perform normal sit<>stand without UE assist to demo increased functional LE strength Baseline:  Goal status: INITIAL   4.  Pt will be able to amb x10 min with normal reciprocal  gait Baseline:  Goal status: INITIAL   5.  Pt will have improved LEFs by >/=10 points to demo MCID Baseline:  Goal status: INITIAL         PLAN:   PT FREQUENCY: 2x/week   PT DURATION: 8 weeks   PLANNED INTERVENTIONS: Therapeutic exercises, Therapeutic activity, Neuromuscular re-education, Balance training, Gait training, Patient/Family education, Self Care, Joint mobilization, Stair training,  Aquatic Therapy, Dry Needling, Electrical stimulation, Cryotherapy, Moist heat, Taping, Vasopneumatic device, Ionotophoresis '4mg'$ /ml Dexamethasone, Manual therapy, and Re-evaluation   PLAN FOR NEXT SESSION: Assess response to HEP. Continue to stretch/strengthen knees/hips. Manual work as indicated. Consider TPDN hamstring/quads (pt will need education). Work on standing tolerance -- consider aquatics.    Mathis Dad, PT 09/17/2022, 10:44 AM

## 2022-09-22 ENCOUNTER — Encounter: Payer: Self-pay | Admitting: Physical Therapy

## 2022-09-22 ENCOUNTER — Ambulatory Visit: Payer: Medicaid Other | Admitting: Physical Therapy

## 2022-09-22 DIAGNOSIS — R2689 Other abnormalities of gait and mobility: Secondary | ICD-10-CM

## 2022-09-22 DIAGNOSIS — G8929 Other chronic pain: Secondary | ICD-10-CM

## 2022-09-22 DIAGNOSIS — M25561 Pain in right knee: Secondary | ICD-10-CM | POA: Diagnosis not present

## 2022-09-22 DIAGNOSIS — M6281 Muscle weakness (generalized): Secondary | ICD-10-CM

## 2022-09-22 DIAGNOSIS — R262 Difficulty in walking, not elsewhere classified: Secondary | ICD-10-CM

## 2022-09-22 DIAGNOSIS — M25661 Stiffness of right knee, not elsewhere classified: Secondary | ICD-10-CM

## 2022-09-22 NOTE — Therapy (Signed)
OUTPATIENT PHYSICAL THERAPY TREATMENT NOTE   Patient Name: Sherry [name] MRN: [id number] DOB:Jun 21, 1986, 37 y.o., female Today's Date: 09/22/2022  PCP: none on file  REFERRING PROVIDER: Currence, Gasper Sells, PA-C   END OF SESSION:   PT End of Session - 09/22/22 0953     Visit Number 4    Number of Visits 16    Date for PT Re-Evaluation 10/22/22    Authorization Type Medicaid [employer]    Authorization Time Period 5 visits approved 09/08/22-11/06/22    Authorization - Visit Number 3    Authorization - Number of Visits 5    PT Start Time 1000    PT Stop Time 1041    PT Time Calculation (min) 41 min    Activity Tolerance Patient tolerated treatment well    Behavior During Therapy WFL for tasks assessed/performed             Past Medical History:  Diagnosis Date   GERD (gastroesophageal reflux disease)    History reviewed. No pertinent surgical history. Patient Active Problem List   Diagnosis Date Noted   Severe major depression, single episode, without psychotic features ([place]) 07/21/2022   Generalized anxiety disorder 07/21/2022    REFERRING DIAG:  M25.561 (ICD-10-CM) - Pain in right knee  G89.29 (ICD-10-CM) - Other chronic pain  M23.51 (ICD-10-CM) - Chronic instability of knee, right knee    THERAPY DIAG:  Chronic pain of right knee  Stiffness of right knee, not elsewhere classified  Difficulty in walking, not elsewhere classified  Muscle weakness (generalized)  Other abnormalities of gait and mobility  Rationale for Evaluation and Treatment Rehabilitation  PERTINENT HISTORY: Partial tear ACL, PCL, ITB, fibular collateral ligament and biceps femoris 05/06/2021; MOST RECENT MRI SHOWS ONLY TEAR OF LCL COMPLEX  PRECAUTIONS: None  SUBJECTIVE:                                                                                                                                                                                      SUBJECTIVE STATEMENT:  Pt reports  that she sees a significant improvement in her knee pain    PAIN:  Are you having pain? Yes: NPRS scale: 7/10 currently, 9 to 10 at worst /10 Pain location: top of knee Pain description: cramp, dull, achy, stiff Aggravating factors: Standing, walking, weight bearing Relieving factors: pain medicine, ice,    OBJECTIVE: (objective measures completed at initial evaluation unless otherwise dated)   DIAGNOSTIC FINDINGS: Knee MRI 05/22/21 IMPRESSION: 1. Complete avulsion of the fibular collateral ligament and biceps femora. Tendon from the fibular head. 2. Partial tear of the IT band just proximal to its tibia insertion. 3. Partial tears  of the ACL and PCL. 4. Avulsion fracture of the tip of the fibular head. 5. Severe popliteus muscle injury with partial tear of the myotendinous junction. 6. Moderate joint effusion.   PATIENT SURVEYS:  LEFS 0%   COGNITION: Overall cognitive status: Within functional limits for tasks assessed                         SENSATION: WFL   EDEMA:  Sometimes   MUSCLE LENGTH: Hamstrings: Right 50 deg; Left 90 deg Thomas test: Right -10 deg; Left 0 deg   POSTURE: increased lumbar lordosis and weight shift left   PALPATION: TTP hamstring and quad on R   LOWER EXTREMITY ROM:   Active ROM Right eval Left eval R 3/13  Hip flexion Limited due to hamstring     Hip extension -10 (supine)     Hip abduction       Hip adduction       Hip internal rotation       Hip external rotation       Knee flexion 55 (in supine)   65 degrees in supine  Knee extension -15 (in supine)     Ankle dorsiflexion       Ankle plantarflexion       Ankle inversion       Ankle eversion        (Blank rows = not tested)   LOWER EXTREMITY MMT:   MMT Right eval Left eval  Hip flexion 4- 5  Hip extension 4- 5  Hip abduction 3 5  Hip adduction      Hip internal rotation      Hip external rotation      Knee flexion 3- (limited by ROM) 5  Knee extension 3- (limited  by ROM 5  Ankle dorsiflexion      Ankle plantarflexion      Ankle inversion      Ankle eversion       (Blank rows = not tested)   LOWER EXTREMITY SPECIAL TESTS:  Knee special tests: Anterior drawer test: negative and Posterior drawer test: negative. No pain noted with testing; however, laxity is notable   FUNCTIONAL TESTS:  Sit<>stand: Maintains R knee extended, full weight on L. Hips flexed on R.    GAIT: Distance walked: 150' Assistive device utilized:  Donjoy knee brace Level of assistance: Complete Independence Comments: Antalgic, R LE in knee brace, hip flexed, knee maintained in extension     TODAY'S TREATMENT:                          [facility] Adult PT Treatment:                                                DATE: 09/22/22 Therapeutic Exercise: nu-step L5 24mwhile taking subjective and planning session with patient Manual knee flexion and heel slide, pt in supine  BFR: 70% occlusion 203 mmHg:  LAQ, SAQ, SLR; 30, 15, 15, 15  DATE: 08/27/22  See HEP below     PATIENT EDUCATION:  Education details: Exam findings, POC, initial HEP Person educated: Patient Education method: Explanation, Demonstration, and Handouts Education comprehension: verbalized understanding, returned demonstration, and needs further education   HOME EXERCISE PROGRAM: Access Code: [email or web address] URL: [email or web address] Date: 08/27/2022 Prepared by: Estill Bamberg April Thurnell Garbe   Exercises - Long Sitting Quad Set  - 1 x daily - 7 x weekly - 2 sets - 10 reps - 3 sec hold - Modified Thomas Stretch  - 1 x daily - 7 x weekly - 2 sets - 1 min hold - Long Sitting Calf Stretch with Strap  - 1 x daily - 7 x weekly - 2 sets - 1 min hold - Sidelying Hip Abduction  - 1 x daily - 7 x weekly - 2 sets - 10 reps - Prone Quadriceps Stretch with Strap  - 1 x daily - 7 x weekly - 2 sets - 1 min hold - Prone Knee Flexion  - 1 x  daily - 7 x weekly - 2 sets - 10 reps - Seated Hamstring Stretch  - 1 x daily - 7 x weekly - 2 sets - 1 min hold   ASSESSMENT:   CLINICAL IMPRESSION: Stefany tolerated session well with no adverse reaction.  Focused on importance of stretching for flexion at home.  She does show a small improvement in flexion compared to eval today.  Continued with BFR to good effect.     OBJECTIVE IMPAIRMENTS: Abnormal gait, decreased activity tolerance, decreased balance, decreased endurance, decreased knowledge of condition, decreased knowledge of use of DME, decreased mobility, difficulty walking, decreased ROM, decreased strength, hypomobility, increased fascial restrictions, increased muscle spasms, impaired flexibility, improper body mechanics, and pain.    ACTIVITY LIMITATIONS: carrying, lifting, bending, sitting, standing, squatting, sleeping, stairs, transfers, bed mobility, bathing, toileting, dressing, hygiene/grooming, locomotion level, and caring for others   PARTICIPATION LIMITATIONS: cleaning, laundry, shopping, community activity, occupation, and yard work   PERSONAL FACTORS: Age, Fitness, Past/current experiences, and Time since onset of injury/illness/exacerbation are also affecting patient's functional outcome.    REHAB POTENTIAL: Fair Has kept leg extended since Nov 2022 with multiple knee ligament tears   CLINICAL DECISION MAKING: Evolving/moderate complexity   EVALUATION COMPLEXITY: Moderate     GOALS: Goals reviewed with patient? Yes   SHORT TERM GOALS: Target date: 09/24/2022     Pt will be ind with initial HEP Baseline: Goal status: MET   2.  Pt will be able to improve knee ROM to 0 - 75 deg Baseline:  3/13: 0-65 Goal status: ongoing   3.  Pt will be able to demo SLR > 60 deg to demo improving muscle extensibility Baseline:  3/13: 65 Goal status: MET     LONG TERM GOALS: Target date: 10/22/2022     Pt will be ind with progression and maintenance of  HEP Baseline:  Goal status: INITIAL   2.  Pt will improve knee ROM to >/= 0 - 90 deg to be able to don/doff her shoes Baseline:  Goal status: INITIAL   3.  Pt will be able to perform normal sit<>stand without UE assist to demo increased functional LE strength Baseline:  Goal status: INITIAL   4.  Pt will be able to amb x10 min with normal reciprocal gait Baseline:  Goal status: INITIAL   5.  Pt will have improved LEFs by >/=10 points to demo MCID Baseline:  Goal status: INITIAL  PLAN:   PT FREQUENCY: 2x/week   PT DURATION: 8 weeks   PLANNED INTERVENTIONS: Therapeutic exercises, Therapeutic activity, Neuromuscular re-education, Balance training, Gait training, Patient/Family education, Self Care, Joint mobilization, Stair training, Aquatic Therapy, Dry Needling, Electrical stimulation, Cryotherapy, Moist heat, Taping, Vasopneumatic device, Ionotophoresis '4mg'$ /ml Dexamethasone, Manual therapy, and Re-evaluation   PLAN FOR NEXT SESSION: Assess response to HEP. Continue to stretch/strengthen knees/hips. Manual work as indicated. Consider TPDN hamstring/quads (pt will need education). Work on standing tolerance -- consider aquatics.    Mathis Dad, PT 09/22/2022, 10:51 AM

## 2022-09-24 ENCOUNTER — Encounter: Payer: Self-pay | Admitting: Physical Therapy

## 2022-09-24 ENCOUNTER — Ambulatory Visit: Payer: Medicaid Other | Admitting: Physical Therapy

## 2022-09-24 DIAGNOSIS — M25661 Stiffness of right knee, not elsewhere classified: Secondary | ICD-10-CM

## 2022-09-24 DIAGNOSIS — M6281 Muscle weakness (generalized): Secondary | ICD-10-CM

## 2022-09-24 DIAGNOSIS — G8929 Other chronic pain: Secondary | ICD-10-CM

## 2022-09-24 DIAGNOSIS — R2689 Other abnormalities of gait and mobility: Secondary | ICD-10-CM

## 2022-09-24 DIAGNOSIS — M25561 Pain in right knee: Secondary | ICD-10-CM | POA: Diagnosis not present

## 2022-09-24 DIAGNOSIS — L299 Pruritus, unspecified: Secondary | ICD-10-CM | POA: Insufficient documentation

## 2022-09-24 DIAGNOSIS — R262 Difficulty in walking, not elsewhere classified: Secondary | ICD-10-CM

## 2022-09-24 NOTE — Therapy (Signed)
OUTPATIENT PHYSICAL THERAPY TREATMENT NOTE   Patient Name: Alliannah Moskovitz MRN: [id number] DOB:27-Jun-1986, 37 y.o., female Today's Date: 09/24/2022  PCP: none on file  REFERRING PROVIDER: Currence, Gasper Sells, PA-C   END OF SESSION:   PT End of Session - 09/24/22 1004     Visit Number 5    Number of Visits 16    Date for PT Re-Evaluation 10/22/22    Authorization Type Medicaid [employer]    Authorization Time Period 5 visits approved 09/08/22-11/06/22    Authorization - Visit Number 4    Authorization - Number of Visits 5    PT Start Time 1004    PT Stop Time 1044    PT Time Calculation (min) 40 min    Activity Tolerance Patient tolerated treatment well    Behavior During Therapy WFL for tasks assessed/performed             Past Medical History:  Diagnosis Date   GERD (gastroesophageal reflux disease)    History reviewed. No pertinent surgical history. Patient Active Problem List   Diagnosis Date Noted   Severe major depression, single episode, without psychotic features ([place]) 07/21/2022   Generalized anxiety disorder 07/21/2022    REFERRING DIAG:  M25.561 (ICD-10-CM) - Pain in right knee  G89.29 (ICD-10-CM) - Other chronic pain  M23.51 (ICD-10-CM) - Chronic instability of knee, right knee    THERAPY DIAG:  Chronic pain of right knee  Stiffness of right knee, not elsewhere classified  Difficulty in walking, not elsewhere classified  Muscle weakness (generalized)  Other abnormalities of gait and mobility  Rationale for Evaluation and Treatment Rehabilitation  PERTINENT HISTORY: Partial tear ACL, PCL, ITB, fibular collateral ligament and biceps femoris 05/06/2021; MOST RECENT MRI SHOWS ONLY TEAR OF LCL COMPLEX  PRECAUTIONS: None  SUBJECTIVE:                                                                                                                                                                                      SUBJECTIVE STATEMENT:    Pt  reports that she feels a significant improvement in her knee strength.  PAIN:  Are you having pain? Yes: NPRS scale: 7/10 currently, 9 to 10 at worst /10 Pain location: top of knee Pain description: cramp, dull, achy, stiff Aggravating factors: Standing, walking, weight bearing Relieving factors: pain medicine, ice,    OBJECTIVE: (objective measures completed at initial evaluation unless otherwise dated)   DIAGNOSTIC FINDINGS: Knee MRI 05/22/21 IMPRESSION: 1. Complete avulsion of the fibular collateral ligament and biceps femora. Tendon from the fibular head. 2. Partial tear of the IT band just proximal to its tibia insertion. 3. Partial tears  of the ACL and PCL. 4. Avulsion fracture of the tip of the fibular head. 5. Severe popliteus muscle injury with partial tear of the myotendinous junction. 6. Moderate joint effusion.   PATIENT SURVEYS:  LEFS 0%   COGNITION: Overall cognitive status: Within functional limits for tasks assessed                         SENSATION: WFL   EDEMA:  Sometimes   MUSCLE LENGTH: Hamstrings: Right 50 deg; Left 90 deg Thomas test: Right -10 deg; Left 0 deg   POSTURE: increased lumbar lordosis and weight shift left   PALPATION: TTP hamstring and quad on R   LOWER EXTREMITY ROM:   Active ROM Right eval Left eval R 3/13 R 3/15  Hip flexion Limited due to hamstring      Hip extension -10 (supine)      Hip abduction        Hip adduction        Hip internal rotation        Hip external rotation        Knee flexion 55 (in supine)   65 degrees in supine 72 supine  Knee extension -15 (in supine)      Ankle dorsiflexion        Ankle plantarflexion        Ankle inversion        Ankle eversion         (Blank rows = not tested)   LOWER EXTREMITY MMT:   MMT Right eval Left eval  Hip flexion 4- 5  Hip extension 4- 5  Hip abduction 3 5  Hip adduction      Hip internal rotation      Hip external rotation      Knee flexion 3-  (limited by ROM) 5  Knee extension 3- (limited by ROM 5  Ankle dorsiflexion      Ankle plantarflexion      Ankle inversion      Ankle eversion       (Blank rows = not tested)   LOWER EXTREMITY SPECIAL TESTS:  Knee special tests: Anterior drawer test: negative and Posterior drawer test: negative. No pain noted with testing; however, laxity is notable   FUNCTIONAL TESTS:  Sit<>stand: Maintains R knee extended, full weight on L. Hips flexed on R.    GAIT: Distance walked: 150' Assistive device utilized:  Donjoy knee brace Level of assistance: Complete Independence Comments: Antalgic, R LE in knee brace, hip flexed, knee maintained in extension     TODAY'S TREATMENT:                          [facility] Adult PT Treatment: Therapeutic Exercise: nu-step L5 36m while taking subjective and planning session with patient Manual knee flexion and heel slide, pt in supine  BFR: 70% occlusion 201 mmHg:  LAQ (2#), SAQ (2#), SLR; 30, 15, 15, 15                                                                                     DATE: 08/27/22  See HEP below     PATIENT EDUCATION:  Education details: Exam findings, POC, initial HEP Person educated: Patient Education method: Explanation, Demonstration, and Handouts Education comprehension: verbalized understanding, returned demonstration, and needs further education   HOME EXERCISE PROGRAM: Access Code: [email or web address] URL: [email or web address] Date: 08/27/2022 Prepared by: Estill Bamberg April Thurnell Garbe   Exercises - Long Sitting Quad Set  - 1 x daily - 7 x weekly - 2 sets - 10 reps - 3 sec hold - Modified Thomas Stretch  - 1 x daily - 7 x weekly - 2 sets - 1 min hold - Long Sitting Calf Stretch with Strap  - 1 x daily - 7 x weekly - 2 sets - 1 min hold - Sidelying Hip Abduction  - 1 x daily - 7 x weekly - 2 sets - 10 reps - Prone Quadriceps Stretch with Strap  - 1 x daily - 7 x weekly - 2 sets - 1 min hold - Prone Knee Flexion  - 1 x  daily - 7 x weekly - 2 sets - 10 reps - Seated Hamstring Stretch  - 1 x daily - 7 x weekly - 2 sets - 1 min hold   ASSESSMENT:   CLINICAL IMPRESSION: Carmine tolerated session well with no adverse reaction.  Continuing with BFR and manual therapy for ROM.  Knee flexion ROM shows significant improvement.  Encouraged frequent stretching at home.    OBJECTIVE IMPAIRMENTS: Abnormal gait, decreased activity tolerance, decreased balance, decreased endurance, decreased knowledge of condition, decreased knowledge of use of DME, decreased mobility, difficulty walking, decreased ROM, decreased strength, hypomobility, increased fascial restrictions, increased muscle spasms, impaired flexibility, improper body mechanics, and pain.    ACTIVITY LIMITATIONS: carrying, lifting, bending, sitting, standing, squatting, sleeping, stairs, transfers, bed mobility, bathing, toileting, dressing, hygiene/grooming, locomotion level, and caring for others   PARTICIPATION LIMITATIONS: cleaning, laundry, shopping, community activity, occupation, and yard work   PERSONAL FACTORS: Age, Fitness, Past/current experiences, and Time since onset of injury/illness/exacerbation are also affecting patient's functional outcome.    REHAB POTENTIAL: Fair Has kept leg extended since Nov 2022 with multiple knee ligament tears   CLINICAL DECISION MAKING: Evolving/moderate complexity   EVALUATION COMPLEXITY: Moderate     GOALS: Goals reviewed with patient? Yes   SHORT TERM GOALS: Target date: 09/24/2022     Pt will be ind with initial HEP Baseline: Goal status: MET   2.  Pt will be able to improve knee ROM to 0 - 75 deg Baseline:  3/13: 0-65 Goal status: ongoing   3.  Pt will be able to demo SLR > 60 deg to demo improving muscle extensibility Baseline:  3/13: 65 Goal status: MET     LONG TERM GOALS: Target date: 10/22/2022     Pt will be ind with progression and maintenance of HEP Baseline:  Goal status: INITIAL    2.  Pt will improve knee ROM to >/= 0 - 90 deg to be able to don/doff her shoes Baseline:  Goal status: INITIAL   3.  Pt will be able to perform normal sit<>stand without UE assist to demo increased functional LE strength Baseline:  Goal status: INITIAL   4.  Pt will be able to amb x10 min with normal reciprocal gait Baseline:  Goal status: INITIAL   5.  Pt will have improved LEFs by >/=10 points to demo MCID Baseline:  Goal status: INITIAL         PLAN:   PT FREQUENCY: 2x/week  PT DURATION: 8 weeks   PLANNED INTERVENTIONS: Therapeutic exercises, Therapeutic activity, Neuromuscular re-education, Balance training, Gait training, Patient/Family education, Self Care, Joint mobilization, Stair training, Aquatic Therapy, Dry Needling, Electrical stimulation, Cryotherapy, Moist heat, Taping, Vasopneumatic device, Ionotophoresis 4mg /ml Dexamethasone, Manual therapy, and Re-evaluation   PLAN FOR NEXT SESSION: Assess response to HEP. Continue to stretch/strengthen knees/hips. Manual work as indicated. Consider TPDN hamstring/quads (pt will need education). Work on standing tolerance -- consider aquatics.    Mathis Dad, PT 09/24/2022, 10:50 AM

## 2022-09-29 ENCOUNTER — Ambulatory Visit: Payer: Medicaid Other

## 2022-10-01 ENCOUNTER — Ambulatory Visit: Payer: Medicaid Other | Admitting: Physical Therapy

## 2022-10-01 ENCOUNTER — Encounter: Payer: Self-pay | Admitting: Physical Therapy

## 2022-10-01 DIAGNOSIS — M25561 Pain in right knee: Secondary | ICD-10-CM | POA: Diagnosis not present

## 2022-10-01 DIAGNOSIS — R2689 Other abnormalities of gait and mobility: Secondary | ICD-10-CM

## 2022-10-01 DIAGNOSIS — M6281 Muscle weakness (generalized): Secondary | ICD-10-CM

## 2022-10-01 DIAGNOSIS — G8929 Other chronic pain: Secondary | ICD-10-CM

## 2022-10-01 DIAGNOSIS — M25661 Stiffness of right knee, not elsewhere classified: Secondary | ICD-10-CM

## 2022-10-01 DIAGNOSIS — R262 Difficulty in walking, not elsewhere classified: Secondary | ICD-10-CM

## 2022-10-01 NOTE — Therapy (Addendum)
OUTPATIENT PHYSICAL THERAPY TREATMENT NOTE   Patient Name: Sherry [name] MRN: [id number] DOB:Mar 17, 1986, 37 y.o., female Today's Date: 10/01/2022  PCP: none on file  REFERRING PROVIDER: Currence, Gasper Sells, PA-C   END OF SESSION:   PT End of Session - 10/01/22 1012     Visit Number 6    Number of Visits 16    Date for PT Re-Evaluation 10/22/22    Authorization Type Medicaid [employer]    Authorization Time Period 5 visits approved 09/08/22-11/06/22    Authorization - Visit Number 5    Authorization - Number of Visits 5    PT Start Time [id number]    PT Stop Time 1100    PT Time Calculation (min) 45 min    Activity Tolerance Patient tolerated treatment well    Behavior During Therapy WFL for tasks assessed/performed             Past Medical History:  Diagnosis Date   GERD (gastroesophageal reflux disease)    History reviewed. No pertinent surgical history. Patient Active Problem List   Diagnosis Date Noted   Severe major depression, single episode, without psychotic features ([place]) 07/21/2022   Generalized anxiety disorder 07/21/2022    REFERRING DIAG:  M25.561 (ICD-10-CM) - Pain in right knee  G89.29 (ICD-10-CM) - Other chronic pain  M23.51 (ICD-10-CM) - Chronic instability of knee, right knee    THERAPY DIAG:  Chronic pain of right knee  Stiffness of right knee, not elsewhere classified  Difficulty in walking, not elsewhere classified  Muscle weakness (generalized)  Other abnormalities of gait and mobility  Rationale for Evaluation and Treatment Rehabilitation  PERTINENT HISTORY: Partial tear ACL, PCL, ITB, fibular collateral ligament and biceps femoris 05/06/2021; MOST RECENT MRI SHOWS ONLY TEAR OF LCL COMPLEX  PRECAUTIONS: None  SUBJECTIVE:                                                                                                                                                                                      SUBJECTIVE STATEMENT:   Pt reports  less tightness. Pt does feel there has been progress with her knee. Pt does feel strength is increasing. Pain is still present along the side (moved from the top of her thigh).   PAIN:  Are you having pain? Yes: NPRS scale: 8/10 currently, 9 to 10 at worst /10 Pain location: side of knee Pain description: cramp, dull, achy, stiff Aggravating factors: Standing, walking, weight bearing Relieving factors: pain medicine, ice,    OBJECTIVE: (objective measures completed at initial evaluation unless otherwise dated)   DIAGNOSTIC FINDINGS: Knee MRI 05/22/21 IMPRESSION: 1. Complete avulsion of the fibular collateral ligament and biceps  femora. Tendon from the fibular head. 2. Partial tear of the IT band just proximal to its tibia insertion. 3. Partial tears of the ACL and PCL. 4. Avulsion fracture of the tip of the fibular head. 5. Severe popliteus muscle injury with partial tear of the myotendinous junction. 6. Moderate joint effusion.   PATIENT SURVEYS:  LEFS 0%   COGNITION: Overall cognitive status: Within functional limits for tasks assessed                         SENSATION: WFL   EDEMA:  Sometimes   MUSCLE LENGTH: Hamstrings: Right 50 deg; Left 90 deg Thomas test: Right -10 deg; Left 0 deg   POSTURE: increased lumbar lordosis and weight shift left   PALPATION: TTP hamstring and quad on R   LOWER EXTREMITY ROM:   Active ROM Right eval Left eval R 3/13 R 3/15 R 3/22  Hip flexion Limited due to hamstring       Hip extension -10 (supine)       Hip abduction         Hip adduction         Hip internal rotation         Hip external rotation         Knee flexion 55 (in supine)   65 degrees in supine 72 supine   Knee extension -15 (in supine)     0 supine  Ankle dorsiflexion         Ankle plantarflexion         Ankle inversion         Ankle eversion          (Blank rows = not tested)   LOWER EXTREMITY MMT:   MMT Right eval Left eval  Hip flexion 4- 5  Hip  extension 4- 5  Hip abduction 3 5  Hip adduction      Hip internal rotation      Hip external rotation      Knee flexion 3- (limited by ROM) 5  Knee extension 3- (limited by ROM 5  Ankle dorsiflexion      Ankle plantarflexion      Ankle inversion      Ankle eversion       (Blank rows = not tested)   LOWER EXTREMITY SPECIAL TESTS:  Knee special tests: Anterior drawer test: negative and Posterior drawer test: negative. No pain noted with testing; however, laxity is notable   FUNCTIONAL TESTS:  Sit<>stand: Maintains R knee extended, full weight on L. Hips flexed on R.    GAIT: Distance walked: 150' Assistive device utilized:  Donjoy knee brace Level of assistance: Complete Independence Comments: Antalgic, R LE in knee brace, hip flexed, knee maintained in extension     TODAY'S TREATMENT:       [facility] Adult PT Treatment:                                                DATE: 10/01/22 Therapeutic Exercise: Supine heel slide x10 Supine knee flexion/ext at 90/90 x10 Supine ITB 2x30 sec Supine quad set with knee ext mobilization 10x5 sec Sidelying hip abd 2x10 Prone quadriceps stretch 2x30 sec Supine SLR with ER 2x10 Therapeutic Activity (to work on weight bearing/weight shifting for sit<>stand) Sitting heel slide x 5 Sitting hip hinge  with glute set 10 x 5 sec  Manual Therapy: STM and IASTM with roller                       [facility] Adult PT Treatment: Therapeutic Exercise: nu-step L5 15m while taking subjective and planning session with patient Manual knee flexion and heel slide, pt in supine  BFR: 70% occlusion 201 mmHg:  LAQ (2#), SAQ (2#), SLR; 30, 15, 15, 15                                                                                        PATIENT EDUCATION:  Education details: Discussed with pt HEP updates, self massage/manual to address lateral thigh pain/tightness Person educated: Patient Education method: Explanation, Demonstration, and Handouts Education  comprehension: verbalized understanding, returned demonstration, and needs further education   HOME EXERCISE PROGRAM: Access Code: [email or web address] URL: [email or web address] Date: 10/01/2022 Prepared by: Estill Bamberg April Thurnell Garbe  Exercises - Sidelying Hip Abduction  - 1 x daily - 7 x weekly - 2 sets - 10 reps - Prone Quadriceps Stretch with Strap  - 1 x daily - 7 x weekly - 2 sets - 1 min hold - Straight Leg Raise with External Rotation  - 1 x daily - 7 x weekly - 2 sets - 10 reps - Supine Heel Slide with Strap  - 1 x daily - 7 x weekly - 2 sets - 10 reps - Prone Hip Extension  - 1 x daily - 7 x weekly - 3 sets - 10 reps - Supine ITB Stretch with Strap  - 1 x daily - 7 x weekly - 2 sets - 30 sec hold   ASSESSMENT:   CLINICAL IMPRESSION: Dyane tolerated session well with no adverse reaction.  Reports pain has moved from the top of her thigh to the side of her leg. S/S appear consistent with ITB tightness. Provided stretches and strengthening to address this. Updated pt's HEP. Re-checked pt's goals -- she is slowly meeting her ROM; but not yet to goal level. Pt is demonstrating increased weight bearing for transfers, standing and during gait; however, still unable to bear full weight on R LE. Pt would benefit from continued PT to work on strength and stability to reach her goals.    OBJECTIVE IMPAIRMENTS: Abnormal gait, decreased activity tolerance, decreased balance, decreased endurance, decreased knowledge of condition, decreased knowledge of use of DME, decreased mobility, difficulty walking, decreased ROM, decreased strength, hypomobility, increased fascial restrictions, increased muscle spasms, impaired flexibility, improper body mechanics, and pain.    ACTIVITY LIMITATIONS: carrying, lifting, bending, sitting, standing, squatting, sleeping, stairs, transfers, bed mobility, bathing, toileting, dressing, hygiene/grooming, locomotion level, and caring for others   PARTICIPATION  LIMITATIONS: cleaning, laundry, shopping, community activity, occupation, and yard work   PERSONAL FACTORS: Age, Fitness, Past/current experiences, and Time since onset of injury/illness/exacerbation are also affecting patient's functional outcome.    REHAB POTENTIAL: Fair Has kept leg extended since Nov 2022 with multiple knee ligament tears   CLINICAL DECISION MAKING: Evolving/moderate complexity   EVALUATION COMPLEXITY: Moderate     GOALS: Goals reviewed with patient? Yes   SHORT TERM  GOALS: Target date: 09/24/2022     Pt will be ind with initial HEP Baseline: Goal status: MET   2.  Pt will be able to improve knee ROM to 0 - 75 deg Baseline:  3/13: 0-65 3/22: 0- 72 Goal status: PARTIALLY MET   3.  Pt will be able to demo SLR > 60 deg to demo improving muscle extensibility Baseline:  3/13: 65 Goal status: MET     LONG TERM GOALS: Target date: 10/22/2022     Pt will be ind with progression and maintenance of HEP Baseline:  Goal status: IN PROGRSS   2.  Pt will improve knee ROM to >/= 0 - 90 deg to be able to don/doff her shoes Baseline:  Goal status: IN PROGRSS   3.  Pt will be able to perform normal sit<>stand without UE assist to demo increased functional LE strength Baseline:  Goal status: IN PROGRSS   4.  Pt will be able to amb x10 min with normal reciprocal gait Baseline:  Goal status: IN PROGRSS   5.  Pt will have improved LEFs by >/=10 points to demo MCID Baseline:  Goal status: IN PROGRSS         PLAN:   PT FREQUENCY: 2x/week   PT DURATION: 8 weeks   PLANNED INTERVENTIONS: Therapeutic exercises, Therapeutic activity, Neuromuscular re-education, Balance training, Gait training, Patient/Family education, Self Care, Joint mobilization, Stair training, Aquatic Therapy, Dry Needling, Electrical stimulation, Cryotherapy, Moist heat, Taping, Vasopneumatic device, Ionotophoresis 4mg /ml Dexamethasone, Manual therapy, and Re-evaluation   PLAN FOR NEXT  SESSION: Assess response to HEP. Continue to stretch/strengthen knees/hips. Manual work as indicated. Consider TPDN hamstring/quads (pt will need education). Work on standing tolerance -- consider aquatics.    Cordarrel Stiefel April Ma L Gracee Ratterree, PT 10/01/2022, 10:13 AM  Check all possible CPT codes: [phone] - PT Re-evaluation, 97110- Therapeutic Exercise, [phone]- Neuro Re-education, [phone] - Gait Training, [phone] - Manual Therapy, [phone] - Therapeutic Activities, [phone] - Self Care, and [phone] - Aquatic therapy    Check all conditions that are expected to impact treatment: Musculoskeletal disorders   If treatment provided at initial evaluation, no treatment charged due to lack of authorization.

## 2022-10-06 ENCOUNTER — Ambulatory Visit: Payer: Medicaid Other | Admitting: Physical Therapy

## 2022-10-08 ENCOUNTER — Encounter: Payer: Medicaid Other | Admitting: Physical Therapy

## 2022-10-13 ENCOUNTER — Ambulatory Visit: Payer: Medicaid Other

## 2022-10-14 NOTE — Therapy (Signed)
OUTPATIENT PHYSICAL THERAPY TREATMENT NOTE   Patient Name: Sherry [name] MRN: [id number] DOB:[birth year], 37 y.o., female Today's Date: 10/15/2022  PCP: none on file  REFERRING PROVIDER: Currence, Vladimir Crofts, PA-C   END OF SESSION:   PT End of Session - 10/15/22 0959     Visit Number 7    Number of Visits 16    Date for PT Re-Evaluation 10/22/22    Authorization Type Medicaid [employer]    Authorization Time Period 4 visits 10/04/22-10/02/22    Authorization - Visit Number 6    Authorization - Number of Visits 9    PT Start Time 1000    PT Stop Time 1040    PT Time Calculation (min) 40 min    Activity Tolerance Patient tolerated treatment well;Patient limited by pain    Behavior During Therapy WFL for tasks assessed/performed              Past Medical History:  Diagnosis Date   GERD (gastroesophageal reflux disease)    History reviewed. No pertinent surgical history. Patient Active Problem List   Diagnosis Date Noted   Severe major depression, single episode, without psychotic features 07/21/2022   Generalized anxiety disorder 07/21/2022    REFERRING DIAG:  M25.561 (ICD-10-CM) - Pain in right knee  G89.29 (ICD-10-CM) - Other chronic pain  M23.51 (ICD-10-CM) - Chronic instability of knee, right knee    THERAPY DIAG:  Chronic pain of right knee  Stiffness of right knee, not elsewhere classified  Difficulty in walking, not elsewhere classified  Muscle weakness (generalized)  Other abnormalities of gait and mobility  Rationale for Evaluation and Treatment Rehabilitation  PERTINENT HISTORY: Partial tear ACL, PCL, ITB, fibular collateral ligament and biceps femoris 05/06/2021; MOST RECENT MRI SHOWS ONLY TEAR OF LCL COMPLEX  PRECAUTIONS: None  SUBJECTIVE:                                                                                                                                                                                      SUBJECTIVE STATEMENT:    Patient reports HEP compliance over past 2 weeks.   PAIN:  Are you having pain? Yes: NPRS scale: 8/10 currently, 9 to 10 at worst /10 Pain location: side of knee Pain description: cramp, dull, achy, stiff Aggravating factors: Standing, walking, weight bearing Relieving factors: pain medicine, ice,    OBJECTIVE: (objective measures completed at initial evaluation unless otherwise dated)   DIAGNOSTIC FINDINGS: Knee MRI 05/22/21 IMPRESSION: 1. Complete avulsion of the fibular collateral ligament and biceps femora. Tendon from the fibular head. 2. Partial tear of the IT band just proximal to its tibia insertion. 3. Partial tears of the  ACL and PCL. 4. Avulsion fracture of the tip of the fibular head. 5. Severe popliteus muscle injury with partial tear of the myotendinous junction. 6. Moderate joint effusion.   PATIENT SURVEYS:  LEFS 0%   COGNITION: Overall cognitive status: Within functional limits for tasks assessed                         SENSATION: WFL   EDEMA:  Sometimes   MUSCLE LENGTH: Hamstrings: Right 50 deg; Left 90 deg Thomas test: Right -10 deg; Left 0 deg   POSTURE: increased lumbar lordosis and weight shift left   PALPATION: TTP hamstring and quad on R   LOWER EXTREMITY ROM:   Active ROM Right eval Left eval R 3/13 R 3/15 R 3/22  Hip flexion Limited due to hamstring       Hip extension -10 (supine)       Hip abduction         Hip adduction         Hip internal rotation         Hip external rotation         Knee flexion 55 (in supine)   65 degrees in supine 72 supine 76 supine  Knee extension -15 (in supine)     0 supine  Ankle dorsiflexion         Ankle plantarflexion         Ankle inversion         Ankle eversion          (Blank rows = not tested)   LOWER EXTREMITY MMT:   MMT Right eval Left eval  Hip flexion 4- 5  Hip extension 4- 5  Hip abduction 3 5  Hip adduction      Hip internal rotation      Hip external rotation       Knee flexion 3- (limited by ROM) 5  Knee extension 3- (limited by ROM 5  Ankle dorsiflexion      Ankle plantarflexion      Ankle inversion      Ankle eversion       (Blank rows = not tested)   LOWER EXTREMITY SPECIAL TESTS:  Knee special tests: Anterior drawer test: negative and Posterior drawer test: negative. No pain noted with testing; however, laxity is notable   FUNCTIONAL TESTS:  Sit<>stand: Maintains R knee extended, full weight on L. Hips flexed on R.    GAIT: Distance walked: 150' Assistive device utilized:  Donjoy knee brace Level of assistance: Complete Independence Comments: Antalgic, R LE in knee brace, hip flexed, knee maintained in extension     TODAY'S TREATMENT:       [facility] Adult PT Treatment:                                                DATE: 10/15/22 Therapeutic Exercise: Supine heel slide x15 Supine knee flexion/ext at 90/90 x10 Supine ITB 2x30 sec Supine quad set with knee ext mobilization 10x5 sec (difficulty) SAQ 3x10 Sidelying hip abd 2x10 Prone quadriceps stretch 2x30 sec Supine SLR with ER 2x10 Therapeutic Activity (to work on weight bearing/weight shifting for sit<>stand) Sitting heel slide x 10 Sitting hip hinge with glute set 10 x 5 sec    [facility] Adult PT Treatment:  DATE: 10/01/22 Therapeutic Exercise: Supine heel slide x10 Supine knee flexion/ext at 90/90 x10 Supine ITB 2x30 sec Supine quad set with knee ext mobilization 10x5 sec Sidelying hip abd 2x10 Prone quadriceps stretch 2x30 sec Supine SLR with ER 2x10 Therapeutic Activity (to work on weight bearing/weight shifting for sit<>stand) Sitting heel slide x 5 Sitting hip hinge with glute set 10 x 5 sec  Manual Therapy: STM and IASTM with roller                       [facility] Adult PT Treatment: Therapeutic Exercise: nu-step L5 30m while taking subjective and planning session with patient Manual knee flexion and heel slide, pt in  supine  BFR: 70% occlusion 201 mmHg:  LAQ (2#), SAQ (2#), SLR; 30, 15, 15, 15                                                                                        PATIENT EDUCATION:  Education details: Discussed with pt HEP updates, self massage/manual to address lateral thigh pain/tightness Person educated: Patient Education method: Explanation, Demonstration, and Handouts Education comprehension: verbalized understanding, returned demonstration, and needs further education   HOME EXERCISE PROGRAM: Access Code: [email or web address] URL: [email or web address] Date: 10/01/2022 Prepared by: Vernon Prey April Kirstie Peri  Exercises - Sidelying Hip Abduction  - 1 x daily - 7 x weekly - 2 sets - 10 reps - Prone Quadriceps Stretch with Strap  - 1 x daily - 7 x weekly - 2 sets - 1 min hold - Straight Leg Raise with External Rotation  - 1 x daily - 7 x weekly - 2 sets - 10 reps - Supine Heel Slide with Strap  - 1 x daily - 7 x weekly - 2 sets - 10 reps - Prone Hip Extension  - 1 x daily - 7 x weekly - 3 sets - 10 reps - Supine ITB Stretch with Strap  - 1 x daily - 7 x weekly - 2 sets - 30 sec hold   ASSESSMENT:   CLINICAL IMPRESSION: Patient presents to PT reporting continued high levels of pain, states she has been compliant with her HEP over last two weeks since last appointment. She had difficulty with quad sets today, but is able to contract quads with SLR and SAQ. She is somewhat limited by pain throughout session, but is able to complete exercises. Patient continues to benefit from skilled PT services and should be progressed as able to improve functional independence.     OBJECTIVE IMPAIRMENTS: Abnormal gait, decreased activity tolerance, decreased balance, decreased endurance, decreased knowledge of condition, decreased knowledge of use of DME, decreased mobility, difficulty walking, decreased ROM, decreased strength, hypomobility, increased fascial restrictions, increased muscle  spasms, impaired flexibility, improper body mechanics, and pain.    ACTIVITY LIMITATIONS: carrying, lifting, bending, sitting, standing, squatting, sleeping, stairs, transfers, bed mobility, bathing, toileting, dressing, hygiene/grooming, locomotion level, and caring for others   PARTICIPATION LIMITATIONS: cleaning, laundry, shopping, community activity, occupation, and yard work   PERSONAL FACTORS: Age, Fitness, Past/current experiences, and Time since onset of injury/illness/exacerbation are also affecting patient's functional outcome.    REHAB POTENTIAL:  Fair Has kept leg extended since Nov 2022 with multiple knee ligament tears   CLINICAL DECISION MAKING: Evolving/moderate complexity   EVALUATION COMPLEXITY: Moderate     GOALS: Goals reviewed with patient? Yes   SHORT TERM GOALS: Target date: 09/24/2022     Pt will be ind with initial HEP Baseline: Goal status: MET   2.  Pt will be able to improve knee ROM to 0 - 75 deg Baseline:  3/13: 0-65 3/22: 0- 72 Goal status: PARTIALLY MET   3.  Pt will be able to demo SLR > 60 deg to demo improving muscle extensibility Baseline:  3/13: 65 Goal status: MET     LONG TERM GOALS: Target date: 10/22/2022     Pt will be ind with progression and maintenance of HEP Baseline:  Goal status: IN PROGRSS   2.  Pt will improve knee ROM to >/= 0 - 90 deg to be able to don/doff her shoes Baseline:  Goal status: IN PROGRSS   3.  Pt will be able to perform normal sit<>stand without UE assist to demo increased functional LE strength Baseline:  Goal status: IN PROGRSS   4.  Pt will be able to amb x10 min with normal reciprocal gait Baseline:  Goal status: IN PROGRSS   5.  Pt will have improved LEFs by >/=10 points to demo MCID Baseline:  Goal status: IN PROGRSS         PLAN:   PT FREQUENCY: 2x/week   PT DURATION: 8 weeks   PLANNED INTERVENTIONS: Therapeutic exercises, Therapeutic activity, Neuromuscular re-education, Balance  training, Gait training, Patient/Family education, Self Care, Joint mobilization, Stair training, Aquatic Therapy, Dry Needling, Electrical stimulation, Cryotherapy, Moist heat, Taping, Vasopneumatic device, Ionotophoresis /ml Dexamethasone, Manual therapy, and Re-evaluation   PLAN FOR NEXT SESSION: Assess response to HEP. Continue to stretch/strengthen knees/hips. Manual work as indicated. Consider TPDN hamstring/quads (pt will need education). Work on standing tolerance -- consider aquatics.    Berta Minor, PTA 10/15/2022, 10:39 AM  Check all possible CPT codes: 16109 - PT Re-evaluation, 97110- Therapeutic Exercise, [phone]- Neuro Re-education, [phone] - Gait Training, [phone] - Manual Therapy, [phone] - Therapeutic Activities, [phone] - Self Care, and [phone] - Aquatic therapy    Check all conditions that are expected to impact treatment: Musculoskeletal disorders   If treatment provided at initial evaluation, no treatment charged due to lack of authorization.

## 2022-10-15 ENCOUNTER — Ambulatory Visit: Payer: Medicaid Other | Attending: Physician Assistant

## 2022-10-15 DIAGNOSIS — R2689 Other abnormalities of gait and mobility: Secondary | ICD-10-CM

## 2022-10-15 DIAGNOSIS — M25661 Stiffness of right knee, not elsewhere classified: Secondary | ICD-10-CM | POA: Diagnosis present

## 2022-10-15 DIAGNOSIS — M25561 Pain in right knee: Secondary | ICD-10-CM | POA: Diagnosis present

## 2022-10-15 DIAGNOSIS — R262 Difficulty in walking, not elsewhere classified: Secondary | ICD-10-CM

## 2022-10-15 DIAGNOSIS — G8929 Other chronic pain: Secondary | ICD-10-CM

## 2022-10-15 DIAGNOSIS — M6281 Muscle weakness (generalized): Secondary | ICD-10-CM

## 2022-10-19 NOTE — Therapy (Incomplete)
OUTPATIENT PHYSICAL THERAPY TREATMENT NOTE   Patient Name: Sherry [name] MRN: [id number] DOB:1986-03-05, 37 y.o., female Today's Date: 10/19/2022  PCP: none on file  REFERRING PROVIDER: Currence, Vladimir Croftsobert Z, PA-C   END OF SESSION:      Past Medical History:  Diagnosis Date   GERD (gastroesophageal reflux disease)    No past surgical history on file. Patient Active Problem List   Diagnosis Date Noted   Severe major depression, single episode, without psychotic features 07/21/2022   Generalized anxiety disorder 07/21/2022    REFERRING DIAG:  M25.561 (ICD-10-CM) - Pain in right knee  G89.29 (ICD-10-CM) - Other chronic pain  M23.51 (ICD-10-CM) - Chronic instability of knee, right knee    THERAPY DIAG:  No diagnosis found.  Rationale for Evaluation and Treatment Rehabilitation  PERTINENT HISTORY: Partial tear ACL, PCL, ITB, fibular collateral ligament and biceps femoris 05/06/2021; MOST RECENT MRI SHOWS ONLY TEAR OF LCL COMPLEX  PRECAUTIONS: None  SUBJECTIVE:                                                                                                                                                                                      SUBJECTIVE STATEMENT:   *** Patient reports HEP compliance over past 2 weeks.   PAIN:  Are you having pain? Yes: NPRS scale: ***8/10 currently, 9 to 10 at worst /10 Pain location: side of knee Pain description: cramp, dull, achy, stiff Aggravating factors: Standing, walking, weight bearing Relieving factors: pain medicine, ice,    OBJECTIVE: (objective measures completed at initial evaluation unless otherwise dated)   DIAGNOSTIC FINDINGS: Knee MRI 05/22/21 IMPRESSION: 1. Complete avulsion of the fibular collateral ligament and biceps femora. Tendon from the fibular head. 2. Partial tear of the IT band just proximal to its tibia insertion. 3. Partial tears of the ACL and PCL. 4. Avulsion fracture of the tip of the fibular  head. 5. Severe popliteus muscle injury with partial tear of the myotendinous junction. 6. Moderate joint effusion.   PATIENT SURVEYS:  LEFS 0%   COGNITION: Overall cognitive status: Within functional limits for tasks assessed                         SENSATION: WFL   EDEMA:  Sometimes   MUSCLE LENGTH: Hamstrings: Right 50 deg; Left 90 deg Thomas test: Right -10 deg; Left 0 deg   POSTURE: increased lumbar lordosis and weight shift left   PALPATION: TTP hamstring and quad on R   LOWER EXTREMITY ROM:   Active ROM Right eval Left eval R 3/13 R 3/15 R 3/22  Hip flexion Limited due to hamstring  Hip extension -10 (supine)       Hip abduction         Hip adduction         Hip internal rotation         Hip external rotation         Knee flexion 55 (in supine)   65 degrees in supine 72 supine 76 supine  Knee extension -15 (in supine)     0 supine  Ankle dorsiflexion         Ankle plantarflexion         Ankle inversion         Ankle eversion          (Blank rows = not tested)   LOWER EXTREMITY MMT:   MMT Right eval Left eval  Hip flexion 4- 5  Hip extension 4- 5  Hip abduction 3 5  Hip adduction      Hip internal rotation      Hip external rotation      Knee flexion 3- (limited by ROM) 5  Knee extension 3- (limited by ROM 5  Ankle dorsiflexion      Ankle plantarflexion      Ankle inversion      Ankle eversion       (Blank rows = not tested)   LOWER EXTREMITY SPECIAL TESTS:  Knee special tests: Anterior drawer test: negative and Posterior drawer test: negative. No pain noted with testing; however, laxity is notable   FUNCTIONAL TESTS:  Sit<>stand: Maintains R knee extended, full weight on L. Hips flexed on R.    GAIT: Distance walked: 150' Assistive device utilized:  Donjoy knee brace Level of assistance: Complete Independence Comments: Antalgic, R LE in knee brace, hip flexed, knee maintained in extension     TODAY'S TREATMENT:       [facility]  Adult PT Treatment:                                                DATE: 10/20/22 Therapeutic Exercise: Supine heel slide x15 Supine knee flexion/ext at 90/90 x10 Supine ITB 2x30 sec Supine quad set with knee ext mobilization 10x5 sec (difficulty) SAQ 3x10 Sidelying hip abd 2x10 Prone quadriceps stretch 2x30 sec Supine SLR with ER 2x10 Therapeutic Activity (to work on weight bearing/weight shifting for sit<>stand) Sitting heel slide x 10 Sitting hip hinge with glute set 10 x 5 sec   [facility] Adult PT Treatment:                                                DATE: 10/15/22 Therapeutic Exercise: Supine heel slide x15 Supine knee flexion/ext at 90/90 x10 Supine ITB 2x30 sec Supine quad set with knee ext mobilization 10x5 sec (difficulty) SAQ 3x10 Sidelying hip abd 2x10 Prone quadriceps stretch 2x30 sec Supine SLR with ER 2x10 Therapeutic Activity (to work on weight bearing/weight shifting for sit<>stand) Sitting heel slide x 10 Sitting hip hinge with glute set 10 x 5 sec    [facility] Adult PT Treatment:  DATE: 10/01/22 Therapeutic Exercise: Supine heel slide x10 Supine knee flexion/ext at 90/90 x10 Supine ITB 2x30 sec Supine quad set with knee ext mobilization 10x5 sec Sidelying hip abd 2x10 Prone quadriceps stretch 2x30 sec Supine SLR with ER 2x10 Therapeutic Activity (to work on weight bearing/weight shifting for sit<>stand) Sitting heel slide x 5 Sitting hip hinge with glute set 10 x 5 sec  Manual Therapy: STM and IASTM with roller                                                                                         PATIENT EDUCATION:  Education details: Discussed with pt HEP updates, self massage/manual to address lateral thigh pain/tightness Person educated: Patient Education method: Explanation, Demonstration, and Handouts Education comprehension: verbalized understanding, returned demonstration, and needs further education    HOME EXERCISE PROGRAM: Access Code: [email or web address] URL: [email or web address] Date: 10/01/2022 Prepared by: Vernon Prey April Kirstie Peri  Exercises - Sidelying Hip Abduction  - 1 x daily - 7 x weekly - 2 sets - 10 reps - Prone Quadriceps Stretch with Strap  - 1 x daily - 7 x weekly - 2 sets - 1 min hold - Straight Leg Raise with External Rotation  - 1 x daily - 7 x weekly - 2 sets - 10 reps - Supine Heel Slide with Strap  - 1 x daily - 7 x weekly - 2 sets - 10 reps - Prone Hip Extension  - 1 x daily - 7 x weekly - 3 sets - 10 reps - Supine ITB Stretch with Strap  - 1 x daily - 7 x weekly - 2 sets - 30 sec hold   ASSESSMENT:   CLINICAL IMPRESSION: ***  Patient presents to PT reporting continued high levels of pain, states she has been compliant with her HEP over last two weeks since last appointment. She had difficulty with quad sets today, but is able to contract quads with SLR and SAQ. She is somewhat limited by pain throughout session, but is able to complete exercises. Patient continues to benefit from skilled PT services and should be progressed as able to improve functional independence.     OBJECTIVE IMPAIRMENTS: Abnormal gait, decreased activity tolerance, decreased balance, decreased endurance, decreased knowledge of condition, decreased knowledge of use of DME, decreased mobility, difficulty walking, decreased ROM, decreased strength, hypomobility, increased fascial restrictions, increased muscle spasms, impaired flexibility, improper body mechanics, and pain.    ACTIVITY LIMITATIONS: carrying, lifting, bending, sitting, standing, squatting, sleeping, stairs, transfers, bed mobility, bathing, toileting, dressing, hygiene/grooming, locomotion level, and caring for others   PARTICIPATION LIMITATIONS: cleaning, laundry, shopping, community activity, occupation, and yard work   PERSONAL FACTORS: Age, Fitness, Past/current experiences, and Time since onset of  injury/illness/exacerbation are also affecting patient's functional outcome.    REHAB POTENTIAL: Fair Has kept leg extended since Nov 2022 with multiple knee ligament tears   CLINICAL DECISION MAKING: Evolving/moderate complexity   EVALUATION COMPLEXITY: Moderate     GOALS: Goals reviewed with patient? Yes   SHORT TERM GOALS: Target date: 09/24/2022     Pt will be ind with initial HEP Baseline: Goal status: MET   2.  Pt will be able to improve knee ROM to 0 - 75 deg Baseline:  3/13: 0-65 3/22: 0- 72 Goal status: PARTIALLY MET   3.  Pt will be able to demo SLR > 60 deg to demo improving muscle extensibility Baseline:  3/13: 65 Goal status: MET     LONG TERM GOALS: Target date: 10/22/2022     Pt will be ind with progression and maintenance of HEP Baseline:  Goal status: IN PROGRSS   2.  Pt will improve knee ROM to >/= 0 - 90 deg to be able to don/doff her shoes Baseline:  Goal status: IN PROGRSS   3.  Pt will be able to perform normal sit<>stand without UE assist to demo increased functional LE strength Baseline:  Goal status: IN PROGRSS   4.  Pt will be able to amb x10 min with normal reciprocal gait Baseline:  Goal status: IN PROGRSS   5.  Pt will have improved LEFs by >/=10 points to demo MCID Baseline:  Goal status: IN PROGRSS         PLAN:   PT FREQUENCY: 2x/week   PT DURATION: 8 weeks   PLANNED INTERVENTIONS: Therapeutic exercises, Therapeutic activity, Neuromuscular re-education, Balance training, Gait training, Patient/Family education, Self Care, Joint mobilization, Stair training, Aquatic Therapy, Dry Needling, Electrical stimulation, Cryotherapy, Moist heat, Taping, Vasopneumatic device, Ionotophoresis 4mg /ml Dexamethasone, Manual therapy, and Re-evaluation   PLAN FOR NEXT SESSION: Assess response to HEP. Continue to stretch/strengthen knees/hips. Manual work as indicated. Consider TPDN hamstring/quads (pt will need education). Work on standing  tolerance -- consider aquatics.    Berta Minor, PTA 10/19/2022, 5:25 PM

## 2022-10-20 ENCOUNTER — Ambulatory Visit: Payer: Medicaid Other

## 2022-10-20 DIAGNOSIS — M25661 Stiffness of right knee, not elsewhere classified: Secondary | ICD-10-CM

## 2022-10-20 DIAGNOSIS — M25561 Pain in right knee: Secondary | ICD-10-CM | POA: Diagnosis not present

## 2022-10-20 DIAGNOSIS — R262 Difficulty in walking, not elsewhere classified: Secondary | ICD-10-CM

## 2022-10-20 DIAGNOSIS — R2689 Other abnormalities of gait and mobility: Secondary | ICD-10-CM

## 2022-10-20 DIAGNOSIS — G8929 Other chronic pain: Secondary | ICD-10-CM

## 2022-10-20 DIAGNOSIS — M6281 Muscle weakness (generalized): Secondary | ICD-10-CM

## 2022-10-21 ENCOUNTER — Ambulatory Visit (INDEPENDENT_AMBULATORY_CARE_PROVIDER_SITE_OTHER): Payer: Medicaid Other | Admitting: Mental Health

## 2022-10-21 DIAGNOSIS — F322 Major depressive disorder, single episode, severe without psychotic features: Secondary | ICD-10-CM | POA: Diagnosis not present

## 2022-10-21 DIAGNOSIS — F411 Generalized anxiety disorder: Secondary | ICD-10-CM

## 2022-10-21 NOTE — Progress Notes (Signed)
   THERAPIST PROGRESS NOTE  Session Time: 1:35 pm ( 53 minutes)  Participation Level: Active  Behavioral Response: Fairly GroomedAlertWNL  Type of Therapy: Individual Therapy  Treatment Goals addressed: STG: [name] will decreased severity of depression sxs AEB development of x 3 effective coping skills with ability to engage in enjoyable activities weekly within the next 90 days.    STG: [name] will increase management of anxiety AEB development of x 3 grounding skills with ability to reframe anxious and depressive thoughts daily as needed within the next 90 days.    ProgressTowards Goals: Progressing  Interventions: CBT and Supportive  Summary:  Sherry [name] is a 37 y.o. female who presents with chief complaint of ongoing anxiety with dx of major depression and generalized anxiety disorder. [name] presents for session alert and oriented; mood and affect adequate; neutral. Speech clear and coherent at normal rate and tone. Engaged and receptive to interventions. Shares improvement in feelings of depression and anxiety and has worked to set increase boundaries with family. Reports thoughts of hx of supporting family in various ways and continues to be of assistance as she can but does not feel as if family are supportive of her. Shares mother to have noted to her "it is what it is," and to have found this hurtful. Shares thoughts of wishing family would come to visit to support her with leg injury or serve some support. Shares to have x 1 friendship in [place] that is supportive towards her. Reports working to not put in high degree of time/energy into family and limiting contact, "my anxiety is so much better when I don't talk to them." Reports to be attending physical therapy and plans to present to a weight loss clinic and explore physical exercises in which she can engage in. Decrease in GAD from 20 to 8. Processing with therapist exploration of her needs and working to set boundaries and limits  with others. Decrease in depressive anxious thoughts reported. Mild improvement with sxs; progress with goals. Denies SI/HI  Suicidal/Homicidal: Nowithout intent/plan  Therapist Response: Therapist engaged Scientist, forensic in therapy session. Completed check in and assessed for current level of functioning, sxs management and level of stressors. Provided safe space for Sherry [name] to share thoughts on feelings of stressors and behavioral changes made to support in reduction of anxiety. Engaged pt in exploring ability to set boundaries with others as needed. Encouraged working to communicate needs to others and ways in which she would like to be supported. Explored presence of supports in life. Encouraged use of coping skills and engaging in helpful internal dialogue and exploring things in which she can do vs. Things in which she can not. Reviewed session, provided follow up and assessed for safety.  Plan: Return again in  x 4 weeks.  Diagnosis: Severe major depression, single episode, without psychotic features  Generalized anxiety disorder  Collaboration of Care: Other None   Patient/Guardian was advised Release of Information must be obtained prior to any record release in order to collaborate their care with an outside provider. Patient/Guardian was advised if they have not already done so to contact the registration department to sign all necessary forms in order for [place] to release information regarding their care.   Consent: Patient/Guardian gives verbal consent for treatment and assignment of benefits for services provided during this visit. Patient/Guardian expressed understanding and agreed to proceed.   Stephan Minister [place], [facility] 10/21/2022

## 2022-10-22 ENCOUNTER — Encounter: Payer: Self-pay | Admitting: Physical Therapy

## 2022-10-22 ENCOUNTER — Ambulatory Visit: Payer: Medicaid Other | Admitting: Physical Therapy

## 2022-10-22 DIAGNOSIS — M25561 Pain in right knee: Secondary | ICD-10-CM | POA: Diagnosis not present

## 2022-10-22 DIAGNOSIS — G8929 Other chronic pain: Secondary | ICD-10-CM

## 2022-10-22 DIAGNOSIS — M25661 Stiffness of right knee, not elsewhere classified: Secondary | ICD-10-CM

## 2022-10-22 DIAGNOSIS — R262 Difficulty in walking, not elsewhere classified: Secondary | ICD-10-CM

## 2022-10-22 NOTE — Therapy (Signed)
OUTPATIENT PHYSICAL THERAPY TREATMENT NOTE   Patient Name: Sherry [name] MRN: [id number] DOB:02-20-1986, 37 y.o., female Today's Date: 10/22/2022  PCP: none on file  REFERRING PROVIDER: Currence, Vladimir Crofts, PA-C   END OF SESSION:   PT End of Session - 10/22/22 1020     Visit Number 9    Number of Visits 16    Date for PT Re-Evaluation 10/22/22    Authorization Type Medicaid [employer]    Authorization Time Period 4 visits 10/04/22-12/02/22    Authorization - Visit Number 8    Authorization - Number of Visits 9    PT Start Time 1020   pt arrived late   PT Stop Time 1045    PT Time Calculation (min) 25 min    Activity Tolerance Patient tolerated treatment well;Patient limited by pain    Behavior During Therapy WFL for tasks assessed/performed               Past Medical History:  Diagnosis Date   GERD (gastroesophageal reflux disease)    History reviewed. No pertinent surgical history. Patient Active Problem List   Diagnosis Date Noted   Severe major depression, single episode, without psychotic features 07/21/2022   Generalized anxiety disorder 07/21/2022    REFERRING DIAG:  M25.561 (ICD-10-CM) - Pain in right knee  G89.29 (ICD-10-CM) - Other chronic pain  M23.51 (ICD-10-CM) - Chronic instability of knee, right knee    THERAPY DIAG:  Chronic pain of right knee  Stiffness of right knee, not elsewhere classified  Difficulty in walking, not elsewhere classified  Rationale for Evaluation and Treatment Rehabilitation  PERTINENT HISTORY: Partial tear ACL, PCL, ITB, fibular collateral ligament and biceps femoris 05/06/2021; MOST RECENT MRI SHOWS ONLY TEAR OF LCL COMPLEX  PRECAUTIONS: None  SUBJECTIVE:                                                                                                                                                                                      SUBJECTIVE STATEMENT:   Pt states that her knee is feeling more achy than pain  today.  She feels like she is improving.   PAIN:  Are you having pain? Yes: NPRS scale: 7/10 currently, 9 to 10 at worst /10 Pain location: side of knee Pain description: cramp, dull, achy, stiff Aggravating factors: Standing, walking, weight bearing Relieving factors: pain medicine, ice,    OBJECTIVE: (objective measures completed at initial evaluation unless otherwise dated)   DIAGNOSTIC FINDINGS: Knee MRI 05/22/21 IMPRESSION: 1. Complete avulsion of the fibular collateral ligament and biceps femora. Tendon from the fibular head. 2. Partial tear of the IT band just proximal to its tibia insertion.  3. Partial tears of the ACL and PCL. 4. Avulsion fracture of the tip of the fibular head. 5. Severe popliteus muscle injury with partial tear of the myotendinous junction. 6. Moderate joint effusion.   PATIENT SURVEYS:  LEFS 0%   COGNITION: Overall cognitive status: Within functional limits for tasks assessed                         SENSATION: WFL   EDEMA:  Sometimes   MUSCLE LENGTH: Hamstrings: Right 50 deg; Left 90 deg Thomas test: Right -10 deg; Left 0 deg   POSTURE: increased lumbar lordosis and weight shift left   PALPATION: TTP hamstring and quad on R   LOWER EXTREMITY ROM:   Active ROM Right eval Left eval R 3/13 R 3/15 R 3/22 R 4/12  Hip flexion Limited due to hamstring        Hip extension -10 (supine)        Hip abduction          Hip adduction          Hip internal rotation          Hip external rotation          Knee flexion 55 (in supine)   65 degrees in supine 72 supine 76 supine 72  Knee extension -15 (in supine)     0 supine   Ankle dorsiflexion          Ankle plantarflexion          Ankle inversion          Ankle eversion           (Blank rows = not tested)   LOWER EXTREMITY MMT:   MMT Right eval Left eval  Hip flexion 4- 5  Hip extension 4- 5  Hip abduction 3 5  Hip adduction      Hip internal rotation      Hip external  rotation      Knee flexion 3- (limited by ROM) 5  Knee extension 3- (limited by ROM 5  Ankle dorsiflexion      Ankle plantarflexion      Ankle inversion      Ankle eversion       (Blank rows = not tested)   LOWER EXTREMITY SPECIAL TESTS:  Knee special tests: Anterior drawer test: negative and Posterior drawer test: negative. No pain noted with testing; however, laxity is notable   FUNCTIONAL TESTS:  Sit<>stand: Maintains R knee extended, full weight on L. Hips flexed on R.    GAIT: Distance walked: 150' Assistive device utilized:  Donjoy knee brace Level of assistance: Complete Independence Comments: Antalgic, R LE in knee brace, hip flexed, knee maintained in extension     TODAY'S TREATMENT:       [facility] Adult PT Treatment:                                                DATE: 10/22/22 Therapeutic Exercise: Nu step - S9 - L5 - 5 min (mod cuing to maximize flexion ROM) Slant board stretch Standing knee flexion on step - 2x20  Gait training: Working on heel toe gait pattern with significant verbal cuing Hurdle walking in // concentrating on avoiding excessive circumduction    [facility] Adult PT Treatment:  DATE: 10/15/22 Therapeutic Exercise: Supine heel slide x15 Supine knee flexion/ext at 90/90 x10 Supine ITB 2x30 sec Supine quad set with knee ext mobilization 10x5 sec (difficulty) SAQ 3x10 Sidelying hip abd 2x10 Prone quadriceps stretch 2x30 sec Supine SLR with ER 2x10 Therapeutic Activity (to work on weight bearing/weight shifting for sit<>stand) Sitting heel slide x 10 Sitting hip hinge with glute set 10 x 5 sec    [facility] Adult PT Treatment:                                                DATE: 10/01/22 Therapeutic Exercise: Supine heel slide x10 Supine knee flexion/ext at 90/90 x10 Supine ITB 2x30 sec Supine quad set with knee ext mobilization 10x5 sec Sidelying hip abd 2x10 Prone quadriceps stretch 2x30 sec Supine SLR with  ER 2x10 Therapeutic Activity (to work on weight bearing/weight shifting for sit<>stand) Sitting heel slide x 5 Sitting hip hinge with glute set 10 x 5 sec  Manual Therapy: STM and IASTM with roller                                                                                         PATIENT EDUCATION:  Education details: Discussed with pt HEP updates, self massage/manual to address lateral thigh pain/tightness Person educated: Patient Education method: Explanation, Demonstration, and Handouts Education comprehension: verbalized understanding, returned demonstration, and needs further education   HOME EXERCISE PROGRAM: Access Code: [email or web address] URL: [email or web address] Date: 10/01/2022 Prepared by: Vernon Prey April Kirstie Peri  Exercises - Sidelying Hip Abduction  - 1 x daily - 7 x weekly - 2 sets - 10 reps - Prone Quadriceps Stretch with Strap  - 1 x daily - 7 x weekly - 2 sets - 1 min hold - Straight Leg Raise with External Rotation  - 1 x daily - 7 x weekly - 2 sets - 10 reps - Supine Heel Slide with Strap  - 1 x daily - 7 x weekly - 2 sets - 10 reps - Prone Hip Extension  - 1 x daily - 7 x weekly - 3 sets - 10 reps - Supine ITB Stretch with Strap  - 1 x daily - 7 x weekly - 2 sets - 30 sec hold   ASSESSMENT:   CLINICAL IMPRESSION: Fedra tolerated session well with no adverse reaction.  Pt arrived late, so visit was truncated.  We concentrated on gait mechanics today, mainly heel toe pattern.  Pt improves with verbal cuing as well as visual cues from mirror.  Her knee flexion remains limited but she is functionally doing fairly well considering this with discomfort mainly concentrated in the quadriceps rather than knee with exercise.     OBJECTIVE IMPAIRMENTS: Abnormal gait, decreased activity tolerance, decreased balance, decreased endurance, decreased knowledge of condition, decreased knowledge of use of DME, decreased mobility, difficulty walking, decreased ROM,  decreased strength, hypomobility, increased fascial restrictions, increased muscle spasms, impaired flexibility, improper body mechanics, and pain.    ACTIVITY LIMITATIONS: carrying, lifting, bending, sitting, standing, squatting,  sleeping, stairs, transfers, bed mobility, bathing, toileting, dressing, hygiene/grooming, locomotion level, and caring for others   PARTICIPATION LIMITATIONS: cleaning, laundry, shopping, community activity, occupation, and yard work   PERSONAL FACTORS: Age, Fitness, Past/current experiences, and Time since onset of injury/illness/exacerbation are also affecting patient's functional outcome.    REHAB POTENTIAL: Fair Has kept leg extended since Nov 2022 with multiple knee ligament tears   CLINICAL DECISION MAKING: Evolving/moderate complexity   EVALUATION COMPLEXITY: Moderate     GOALS: Goals reviewed with patient? Yes   SHORT TERM GOALS: Target date: 09/24/2022     Pt will be ind with initial HEP Baseline: Goal status: MET   2.  Pt will be able to improve knee ROM to 0 - 75 deg Baseline:  3/13: 0-65 3/22: 0- 72 Goal status: PARTIALLY MET   3.  Pt will be able to demo SLR > 60 deg to demo improving muscle extensibility Baseline:  3/13: 65 Goal status: MET     LONG TERM GOALS: Target date: 10/22/2022     Pt will be ind with progression and maintenance of HEP Baseline:  Goal status: IN PROGRSS   2.  Pt will improve knee ROM to >/= 0 - 90 deg to be able to don/doff her shoes Baseline:  Goal status: IN PROGRSS   3.  Pt will be able to perform normal sit<>stand without UE assist to demo increased functional LE strength Baseline:  Goal status: IN PROGRSS   4.  Pt will be able to amb x10 min with normal reciprocal gait Baseline:  Goal status: IN PROGRSS   5.  Pt will have improved LEFs by >/=10 points to demo MCID Baseline:  Goal status: IN PROGRSS         PLAN:   PT FREQUENCY: 2x/week   PT DURATION: 8 weeks   PLANNED  INTERVENTIONS: Therapeutic exercises, Therapeutic activity, Neuromuscular re-education, Balance training, Gait training, Patient/Family education, Self Care, Joint mobilization, Stair training, Aquatic Therapy, Dry Needling, Electrical stimulation, Cryotherapy, Moist heat, Taping, Vasopneumatic device, Ionotophoresis 4mg /ml Dexamethasone, Manual therapy, and Re-evaluation   PLAN FOR NEXT SESSION: Assess response to HEP. Continue to stretch/strengthen knees/hips. Manual work as indicated. Consider TPDN hamstring/quads (pt will need education). Work on standing tolerance -- consider aquatics.    Fredderick Phenix, PT 10/22/2022, 11:44 AM

## 2022-11-02 ENCOUNTER — Other Ambulatory Visit: Payer: Self-pay

## 2022-11-02 ENCOUNTER — Encounter: Payer: Self-pay | Admitting: Obstetrics and Gynecology

## 2022-11-02 ENCOUNTER — Ambulatory Visit (INDEPENDENT_AMBULATORY_CARE_PROVIDER_SITE_OTHER): Payer: Medicaid Other | Admitting: Obstetrics and Gynecology

## 2022-11-02 VITALS — BP 123/88 | HR 87 | Wt 241.4 lb

## 2022-11-02 DIAGNOSIS — N939 Abnormal uterine and vaginal bleeding, unspecified: Secondary | ICD-10-CM

## 2022-11-02 NOTE — Progress Notes (Signed)
NEW GYNECOLOGY PATIENT Patient name: Sherry [name] MRN [id number]  Date of birth: [birth year] Chief Complaint:   Menstrual Problem     History:  Sherry [name] is a 37 y.o. No obstetric history on file. being seen today for abnormal bleeding.    No pain with intercourse most recently, and then had bleeding following intercourse  Typically has light spotting and then heavy bleeding but this time had more notable but then  Beeding for 5 days, wearing 2 pads and changing every hour, slight cramping with intermittent clots Used metrogel earlier and wondering if that made her period come on Had intercourse w/ a strap and then had bleeding the same day Previously had HA but have resolved once BP under control [facility] contraindications/concerns: HTN well controlled States this feels a bit different and having bleeding Already started on birth control; was not on birth control when most recent bleeding started Reports having a pap smear normal and was done last year at other facitlity      Gynecologic History Patient's last menstrual period was 10/30/2022 (approximate). Contraception: none Last Pap: reports having normal pap smear last year Last Mammogram: n/a Last Colonoscopy: n/a  Obstetric History OB History  No obstetric history on file.    Past Medical History:  Diagnosis Date   GERD (gastroesophageal reflux disease)     No past surgical history on file.  Current Outpatient Medications on File Prior to Visit  Medication Sig Dispense Refill   acetaminophen (LIQUID ACETAMINOPHEN) 160 MG/5ML liquid Take by mouth.     amoxicillin-clavulanate (AUGMENTIN) 875-125 MG tablet Take 1 tablet by mouth 2 (two) times daily. 20 tablet 0   diclofenac (CATAFLAM) 50 MG tablet Take by mouth.     doxycycline (VIBRAMYCIN) 100 MG capsule Take 100 mg by mouth 2 (two) times daily.     Ferrous Sulfate Dried (SLOW RELEASE IRON) 45 MG TBCR Take by mouth.     fluconazole (DIFLUCAN) 150 MG tablet Take by  mouth.     Fluocinolone Acetonide 0.01 % OIL 4 drops to affected ear(s) daily as needed for itching     hydrochlorothiazide (HYDRODIURIL) 25 MG tablet Take 25 mg by mouth daily.     IRON, FERROUS SULFATE, PO Take by mouth.     LORYNA 3-0.02 MG tablet Take 1 tablet by mouth daily.     naproxen (NAPROSYN) 500 MG tablet Take 500 mg by mouth 2 (two) times daily with a meal.     sertraline (ZOLOFT) 50 MG tablet Take 1 tablet (50 mg total) by mouth daily. 30 tablet 1   traZODone (DESYREL) 50 MG tablet Take 1 tablet (50 mg total) by mouth at bedtime. 30 tablet 1   metroNIDAZOLE (METROGEL) 0.75 % vaginal gel Place vaginally 2 (two) times daily. (Patient not taking: Reported on 11/02/2022)     No current facility-administered medications on file prior to visit.    Allergies  Allergen Reactions   Fish Allergy    Shellfish Allergy     Social History:  reports that she quit smoking about 3 years ago. Her smoking use included cigarettes. She has never used smokeless tobacco. She reports that she does not drink alcohol and does not use drugs.  No family history on file.  The following portions of the patient's history were reviewed and updated as appropriate: allergies, current medications, past family history, past medical history, past social history, past surgical history and problem list.  Review of Systems Pertinent items noted in HPI and remainder of  comprehensive ROS otherwise negative.  Physical Exam:  BP 123/88   Pulse 87   Wt 241 lb 6.4 oz (109.5 kg)   LMP 10/30/2022 (Approximate)   BMI 40.17 kg/m  Physical Exam Vitals and nursing note reviewed.  Constitutional:      Appearance: Normal appearance.  Cardiovascular:     Rate and Rhythm: Normal rate.  Pulmonary:     Effort: Pulmonary effort is normal.     Breath sounds: Normal breath sounds.  Neurological:     General: No focal deficit present.     Mental Status: She is alert and oriented to person, place, and time.   Psychiatric:        Mood and Affect: Mood normal.        Behavior: Behavior normal.        Thought Content: Thought content normal.        Judgment: Judgment normal.        Assessment and Plan:   1. Abnormal uterine bleeding (AUB) Routine AUB labs completed with PCP and wnl. Pelvic US ordered to assess for structural component. Initially interested in switching to patch but opted to continue with pills for menstrual management due to slightly increased risk of VTE with elevated BMI. Interested in getting pregnant in the future as well. Will follow up with   The use of the oral contraceptive has been fully discussed with the patient. This includes the proper method to initiate (i.e. Sunday start after next normal menstrual onset versus same day start) and continue the pills, the need for regular compliance to ensure adequate contraceptive effect, the physiology which make the pill effective, the instructions for what to do in event of a missed pill, and warnings about anticipated minor side effects such as breakthrough spotting, nausea, breast tenderness, weight changes, acne, headaches, etc.  They have been told of the more serious potential side effects such as MI, stroke, and deep vein thrombosis, all of which are very unlikely.  They have been asked to report any signs of such serious problems immediately.  They should back up the pill with a condom during any cycle in which antibiotics are prescribed, and during the first cycle as well. The need for additional protection, such as a condom, to prevent exposure to sexually transmitted diseases has also been discussed- the patient has been clearly reminded that OCP's cannot protect them against diseases such as HIV and others. They understand and wish to take the medication as prescribed.  - US PELVIC COMPLETE WITH TRANSVAGINAL; Future    Routine preventative health maintenance measures emphasized. Please refer to After Visit Summary for  other counseling recommendations.   Follow-up: Return for GYN Follow Up.      Lorriane Shire, MD Obstetrician & Gynecologist, [facility] [employer], [facility]

## 2022-11-03 ENCOUNTER — Ambulatory Visit: Payer: Medicaid Other | Admitting: Physical Therapy

## 2022-11-03 ENCOUNTER — Encounter: Payer: Self-pay | Admitting: Physical Therapy

## 2022-11-03 DIAGNOSIS — R262 Difficulty in walking, not elsewhere classified: Secondary | ICD-10-CM

## 2022-11-03 DIAGNOSIS — M25561 Pain in right knee: Secondary | ICD-10-CM | POA: Diagnosis not present

## 2022-11-03 DIAGNOSIS — G8929 Other chronic pain: Secondary | ICD-10-CM

## 2022-11-03 DIAGNOSIS — M6281 Muscle weakness (generalized): Secondary | ICD-10-CM

## 2022-11-03 DIAGNOSIS — M25661 Stiffness of right knee, not elsewhere classified: Secondary | ICD-10-CM

## 2022-11-03 NOTE — Therapy (Signed)
OUTPATIENT PHYSICAL THERAPY TREATMENT NOTE   Patient Name: Sherry [name] MRN: [id number] DOB:1985-11-25, 37 y.o., female Today's Date: 11/03/2022  PCP: none on file  REFERRING PROVIDER: Currence, Vladimir Crofts, PA-C   END OF SESSION:   PT End of Session - 11/03/22 0915     Visit Number 10    Number of Visits 16    Date for PT Re-Evaluation 12/29/22    Authorization Type Medicaid [employer]    Authorization Time Period 4 visits 10/04/22-12/02/22    Authorization - Visit Number 9    Authorization - Number of Visits 9    PT Start Time 0915    PT Stop Time 0956    PT Time Calculation (min) 41 min    Activity Tolerance Patient tolerated treatment well;Patient limited by pain    Behavior During Therapy [facility] for tasks assessed/performed               Past Medical History:  Diagnosis Date   GERD (gastroesophageal reflux disease)    History reviewed. No pertinent surgical history. Patient Active Problem List   Diagnosis Date Noted   Itching of ear 09/24/2022   Anxiety 07/23/2022   Moderate episode of recurrent major depressive disorder 07/23/2022   Severe major depression, single episode, without psychotic features 07/21/2022   Generalized anxiety disorder 07/21/2022   Essential hypertension 07/02/2022    REFERRING DIAG:  M25.561 (ICD-10-CM) - Pain in right knee  G89.29 (ICD-10-CM) - Other chronic pain  M23.51 (ICD-10-CM) - Chronic instability of knee, right knee    THERAPY DIAG:  Chronic pain of right knee - Plan: PT plan of care cert/re-cert  Stiffness of right knee, not elsewhere classified - Plan: PT plan of care cert/re-cert  Difficulty in walking, not elsewhere classified - Plan: PT plan of care cert/re-cert  Muscle weakness (generalized) - Plan: PT plan of care cert/re-cert  Rationale for Evaluation and Treatment Rehabilitation  PERTINENT HISTORY: Partial tear ACL, PCL, ITB, fibular collateral ligament and biceps femoris 05/06/2021; MOST RECENT MRI SHOWS  ONLY TEAR OF LCL COMPLEX  PRECAUTIONS: None  SUBJECTIVE:                                                                                                                                                                                      SUBJECTIVE STATEMENT:   Pt states that her knee is feeling more achy than pain today.  She feels like she is improving.   PAIN:  Are you having pain? Yes: NPRS scale: 7/10 currently, 9 to 10 at worst /10 Pain location: side of knee Pain description: cramp, dull, achy, stiff Aggravating factors: Standing, walking, weight bearing Relieving factors:  pain medicine, ice,    OBJECTIVE: (objective measures completed at initial evaluation unless otherwise dated)   DIAGNOSTIC FINDINGS: Knee MRI 05/22/21 IMPRESSION: 1. Complete avulsion of the fibular collateral ligament and biceps femora. Tendon from the fibular head. 2. Partial tear of the IT band just proximal to its tibia insertion. 3. Partial tears of the ACL and PCL. 4. Avulsion fracture of the tip of the fibular head. 5. Severe popliteus muscle injury with partial tear of the myotendinous junction. 6. Moderate joint effusion.   PATIENT SURVEYS:  LEFS 0%   COGNITION: Overall cognitive status: Within functional limits for tasks assessed                         SENSATION: WFL   EDEMA:  Sometimes   MUSCLE LENGTH: Hamstrings: Right 50 deg; Left 90 deg Thomas test: Right -10 deg; Left 0 deg   POSTURE: increased lumbar lordosis and weight shift left   PALPATION: TTP hamstring and quad on R   LOWER EXTREMITY ROM:   Active ROM Right eval Left eval R 3/13 R 3/15 R 3/22 R 4/12  Hip flexion Limited due to hamstring        Hip extension -10 (supine)        Hip abduction          Hip adduction          Hip internal rotation          Hip external rotation          Knee flexion 55 (in supine)   65 degrees in supine 72 supine 76 supine 72  Knee extension -15 (in supine)     0 supine    Ankle dorsiflexion          Ankle plantarflexion          Ankle inversion          Ankle eversion           (Blank rows = not tested)   LOWER EXTREMITY MMT:   MMT Right eval Left eval  Hip flexion 4- 5  Hip extension 4- 5  Hip abduction 3 5  Hip adduction      Hip internal rotation      Hip external rotation      Knee flexion 3- (limited by ROM) 5  Knee extension 3- (limited by ROM 5  Ankle dorsiflexion      Ankle plantarflexion      Ankle inversion      Ankle eversion       (Blank rows = not tested)   LOWER EXTREMITY SPECIAL TESTS:  Knee special tests: Anterior drawer test: negative and Posterior drawer test: negative. No pain noted with testing; however, laxity is notable   FUNCTIONAL TESTS:  Sit<>stand: Maintains R knee extended, full weight on L. Hips flexed on R.    GAIT: Distance walked: 150' Assistive device utilized:  Donjoy knee brace Level of assistance: Complete Independence Comments: Antalgic, R LE in knee brace, hip flexed, knee maintained in extension     TODAY'S TREATMENT:       [facility] Treatment:                                                DATE: 11/03/22 Therapeutic Exercise: Nu step - S9 - L5 -  5 min (mod cuing to maximize flexion ROM) Slant board stretch Standing knee flexion on step - 2x20 Knee ext machine - B 3x10 - 5# Knee flexion machine - B - 3x10 - 20#  Gait training: Working on heel toe gait pattern with significant verbal cuing Hurdle walking in // concentrating on avoiding excessive circumduction    HOME EXERCISE PROGRAM: Access Code: [email or web address] URL: [email or web address] Date: 10/01/2022 Prepared by: Vernon Prey April Kirstie Peri  Exercises - Sidelying Hip Abduction  - 1 x daily - 7 x weekly - 2 sets - 10 reps - Prone Quadriceps Stretch with Strap  - 1 x daily - 7 x weekly - 2 sets - 1 min hold - Straight Leg Raise with External Rotation  - 1 x daily - 7 x weekly - 2 sets - 10 reps - Supine Heel Slide with  Strap  - 1 x daily - 7 x weekly - 2 sets - 10 reps - Prone Hip Extension  - 1 x daily - 7 x weekly - 3 sets - 10 reps - Supine ITB Stretch with Strap  - 1 x daily - 7 x weekly - 2 sets - 30 sec hold   ASSESSMENT:   CLINICAL IMPRESSION: Sherry [name] has progressed well with therapy.  Improved impairments include: R knee ROM, R knee pain, gait, balance.  Functional improvements include: ability to stand and walk for longer for ADLs such as shopping and housework, ease in sit to stand tranfers.  Progressions needed include: continued work on flexion ROM to >90 degrees, continued work on direct quad strengthening, gait, balance, squat.  Barriers to progress include: 1 year of immobilization following initial injury.  Sherry [name] is making slow, but consistent progress toward her goals; slow progress is expected d/t extended time with knee immobilized.  Her confidence in W/B is increasing and her gait is improving.  Please see GOALS section for progress on short term and long term goals established at evaluation.  I recommend continuation of PT to allow completion of remaining goals and continued functional progression.     OBJECTIVE IMPAIRMENTS: Abnormal gait, decreased activity tolerance, decreased balance, decreased endurance, decreased knowledge of condition, decreased knowledge of use of DME, decreased mobility, difficulty walking, decreased ROM, decreased strength, hypomobility, increased fascial restrictions, increased muscle spasms, impaired flexibility, improper body mechanics, and pain.    ACTIVITY LIMITATIONS: carrying, lifting, bending, sitting, standing, squatting, sleeping, stairs, transfers, bed mobility, bathing, toileting, dressing, hygiene/grooming, locomotion level, and caring for others   PARTICIPATION LIMITATIONS: cleaning, laundry, shopping, community activity, occupation, and yard work   PERSONAL FACTORS: Age, Fitness, Past/current experiences, and Time since onset of  injury/illness/exacerbation are also affecting patient's functional outcome.    REHAB POTENTIAL: Fair Has kept leg extended since Nov 2022 with multiple knee ligament tears   CLINICAL DECISION MAKING: Evolving/moderate complexity   EVALUATION COMPLEXITY: Moderate     GOALS: Goals reviewed with patient? Yes   SHORT TERM GOALS: Target date: 09/24/2022     Pt will be ind with initial HEP Baseline: Goal status: MET   2.  Pt will be able to improve knee ROM to 0 - 75 deg Baseline:  3/13: 0-65 3/22: 0- 72 Goal status: PARTIALLY MET   3.  Pt will be able to demo SLR > 60 deg to demo improving muscle extensibility Baseline:  3/13: 65 Goal status: MET     LONG TERM GOALS: Target date: 10/22/2022 (extended to 12/29/2022)     Pt will be ind with progression and  maintenance of HEP Baseline:  4/24: she has been HEP compliant Goal status: IN PROGRSS   2.  Pt will improve knee ROM to >/= 0 - 90 deg to be able to don/doff her shoes Baseline:  55 4/24: 72 Goal status: IN PROGRSS   3.  Pt will be able to perform normal sit<>stand without UE assist to demo increased functional LE strength Baseline:  4/24: Requires UE support with compensation toward L Leg Goal status: IN PROGRSS   4.  Pt will be able to amb x10 min with normal reciprocal gait Baseline:  4/24: circumduction gait d/t lack of knee flexion Goal status: IN PROGRSS   5.  Pt will have improved LEFs by >/=10  points to demo MCID Baseline: 0 4/24: 11/80 Goal status: MET   6. Pt will have improved LEFs by >/=30  points to demo MCID Baseline: 0 4/24: 11/80 Goal status: NEW           PLAN:   PT FREQUENCY: 2x/week   PT DURATION: 8 weeks (12/29/2022)   PLANNED INTERVENTIONS: Therapeutic exercises, Therapeutic activity, Neuromuscular re-education, Balance training, Gait training, Patient/Family education, Self Care, Joint mobilization, Stair training, Aquatic Therapy, Dry Needling, Electrical stimulation,  Cryotherapy, Moist heat, Taping, Vasopneumatic device, Ionotophoresis 4mg /ml Dexamethasone, Manual therapy, and Re-evaluation   PLAN FOR NEXT SESSION: Assess response to HEP. Continue to stretch/strengthen knees/hips. Manual work as indicated. Consider TPDN hamstring/quads (pt will need education). Work on standing tolerance -- consider aquatics.    Fredderick Phenix, PT 11/03/2022, 10:00 AM

## 2022-11-10 ENCOUNTER — Ambulatory Visit (HOSPITAL_COMMUNITY)
Admission: RE | Admit: 2022-11-10 | Discharge: 2022-11-10 | Disposition: A | Discharge status: Home / Self Care | Payer: Medicaid Other | Source: Ambulatory Visit | Attending: Obstetrics and Gynecology | Admitting: Obstetrics and Gynecology

## 2022-11-10 DIAGNOSIS — N939 Abnormal uterine and vaginal bleeding, unspecified: Secondary | ICD-10-CM

## 2022-11-30 ENCOUNTER — Ambulatory Visit (HOSPITAL_COMMUNITY): Payer: Medicaid Other | Admitting: Mental Health

## 2022-12-16 ENCOUNTER — Ambulatory Visit: Payer: Medicaid Other | Admitting: Obstetrics and Gynecology

## 2022-12-21 ENCOUNTER — Ambulatory Visit (HOSPITAL_COMMUNITY): Payer: Medicaid Other | Admitting: Mental Health

## 2022-12-21 ENCOUNTER — Encounter (HOSPITAL_COMMUNITY): Payer: Self-pay

## 2022-12-21 ENCOUNTER — Telehealth (HOSPITAL_COMMUNITY): Payer: Self-pay | Admitting: Mental Health

## 2022-12-21 NOTE — Telephone Encounter (Signed)
Therapist sent link to email and cellphone for tele-therapy appointment. No response after x 10 minutes. Contacted pt via telephone; no answer. Left HIPAA compliant VM to reschedule. NS

## 2023-03-15 ENCOUNTER — Ambulatory Visit (HOSPITAL_COMMUNITY): Payer: Medicaid Other | Admitting: Mental Health

## 2023-05-05 IMAGING — CT CT RENAL STONE PROTOCOL
2 of 4 series · 17 of 46 positions shown, 19 images · non-contrast
Comparison: None Available.

CLINICAL DATA: Flank pain



[Series 2: stone full · axial · 0.93mm/px · z∈[-167,+278]mm · 14 of 97 slices shown, 16 images]
[im 4/97  soft-tissue]
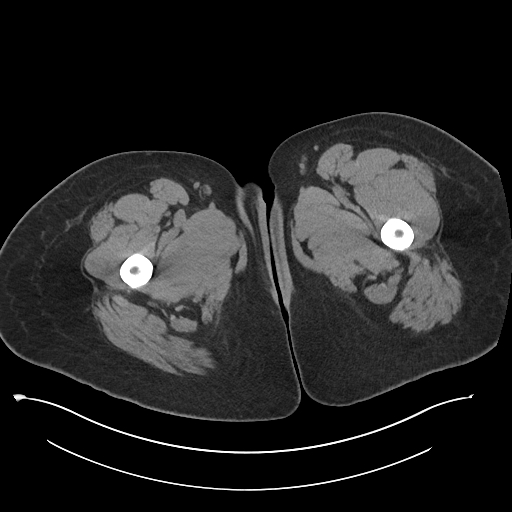
[im 4/97  bone]
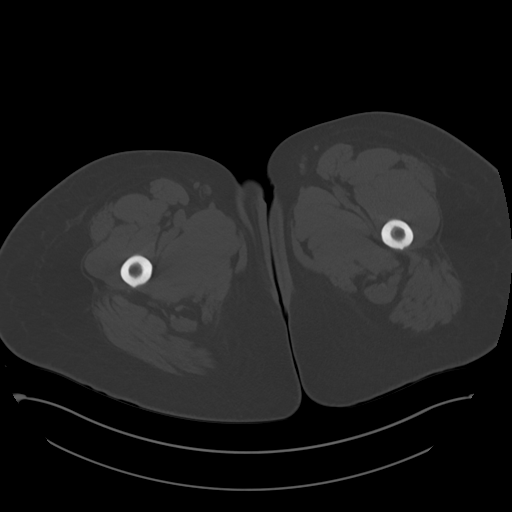
[im 12/97  soft-tissue]
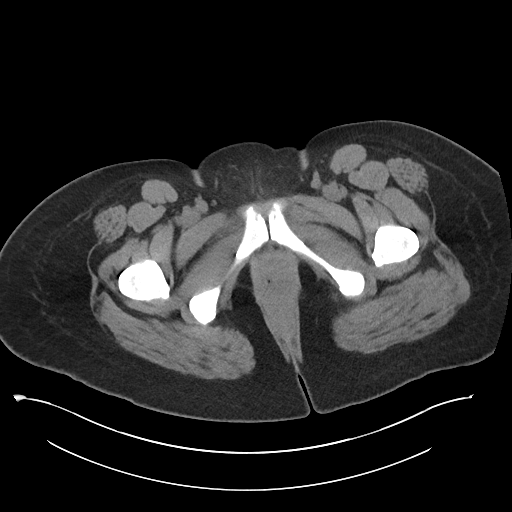
[im 20/97  soft-tissue]
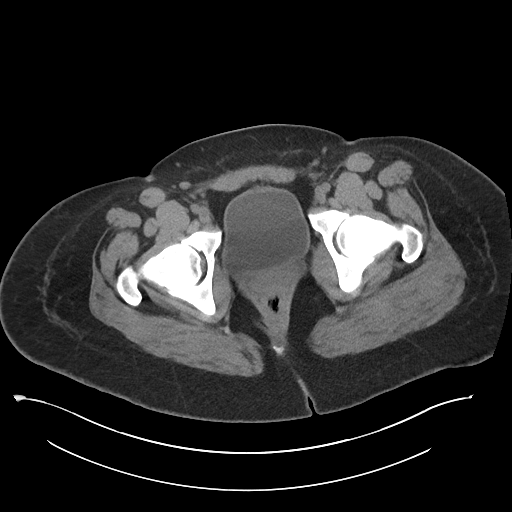
[im 27/97  soft-tissue]
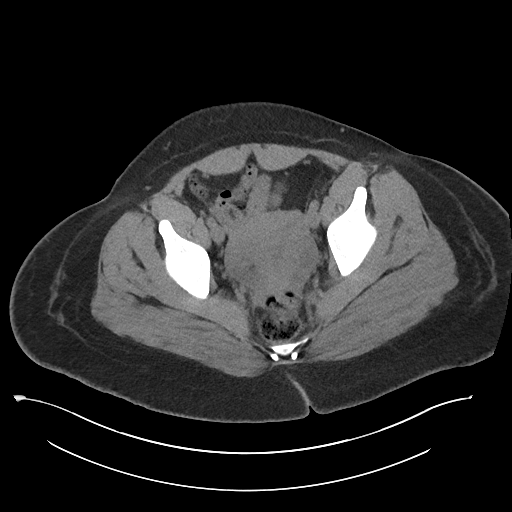
[im 31/97  soft-tissue]
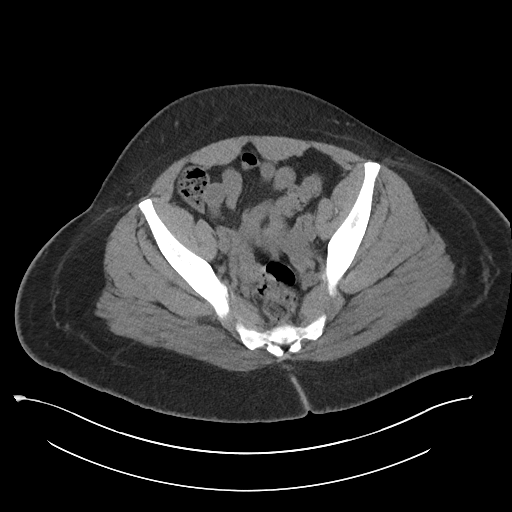
[im 39/97  soft-tissue]
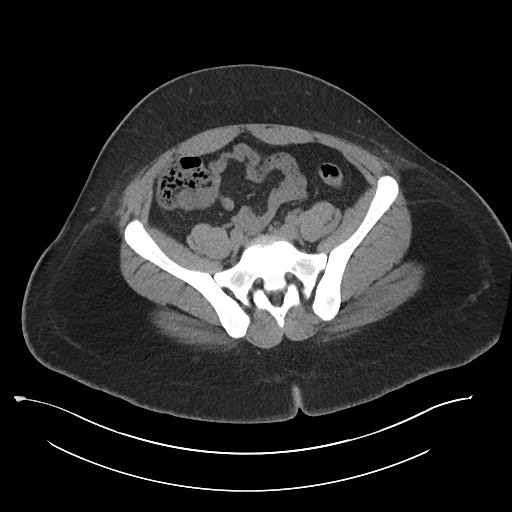
[im 47/97  soft-tissue]
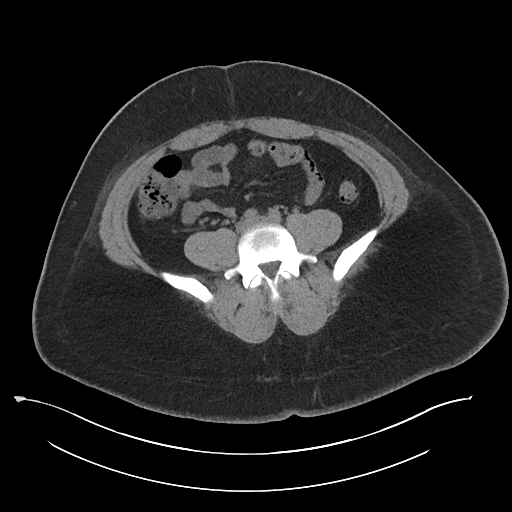
[im 50/97  soft-tissue]
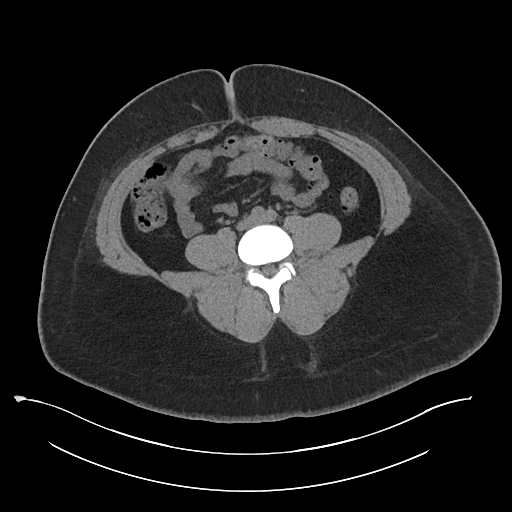
[im 58/97  soft-tissue]
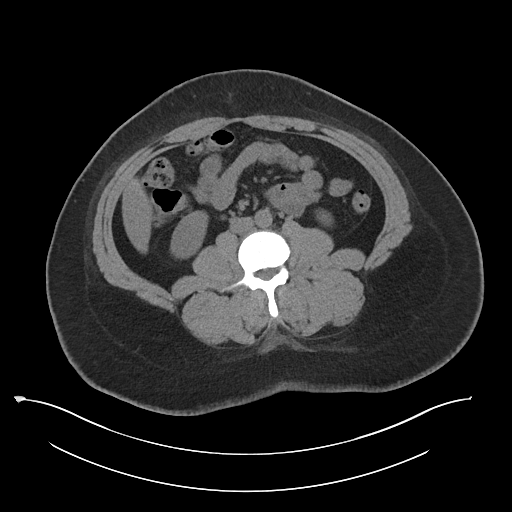
[im 58/97  bone]
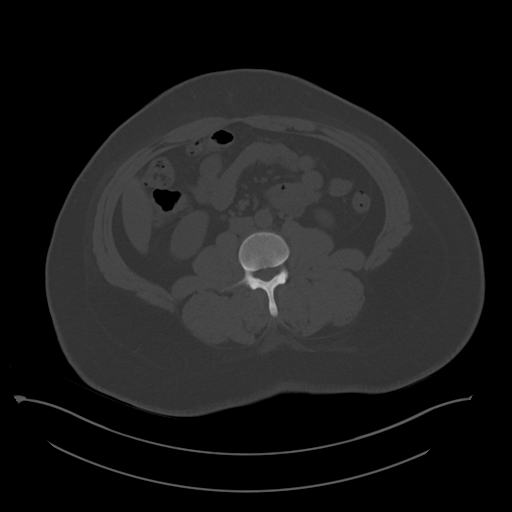
[im 66/97  soft-tissue]
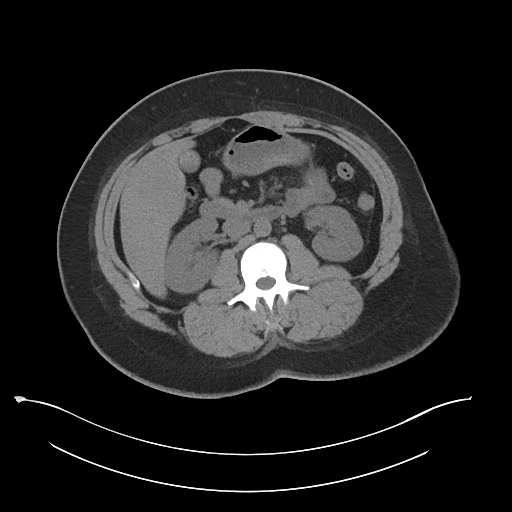
[im 73/97  soft-tissue]
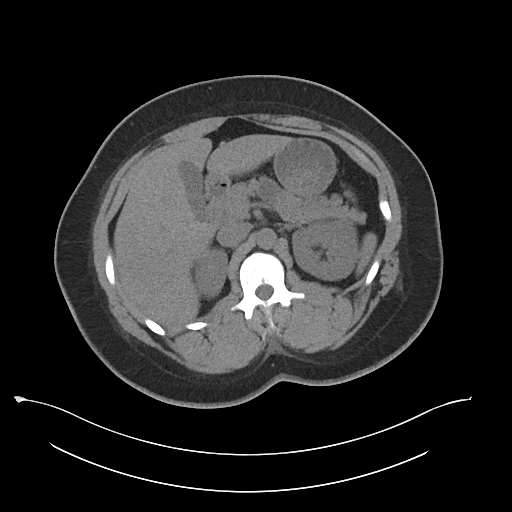
[im 77/97  soft-tissue]
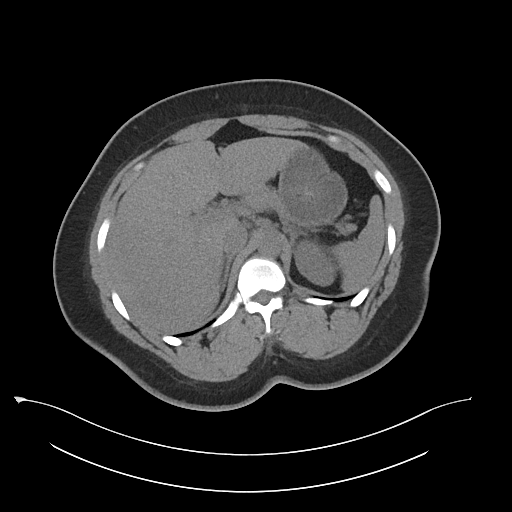
[im 85/97  soft-tissue]
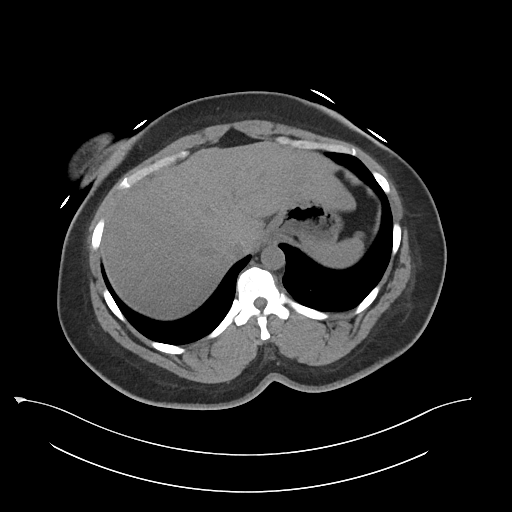
[im 93/97  soft-tissue]
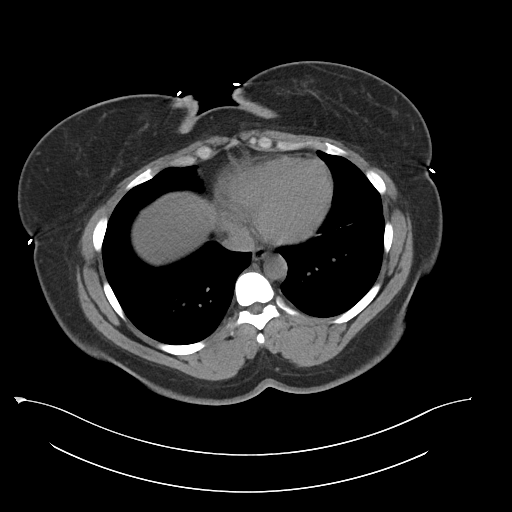

[Series 5: coronal · coronal · 0.96mm/px · 3 of 118 slices shown]
[im 40/118  soft-tissue]
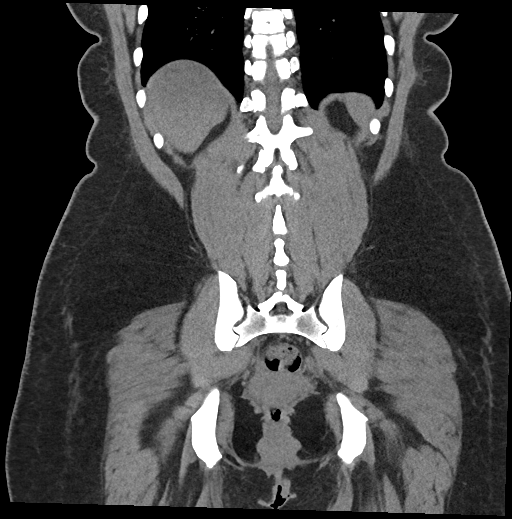
[im 53/118  soft-tissue]
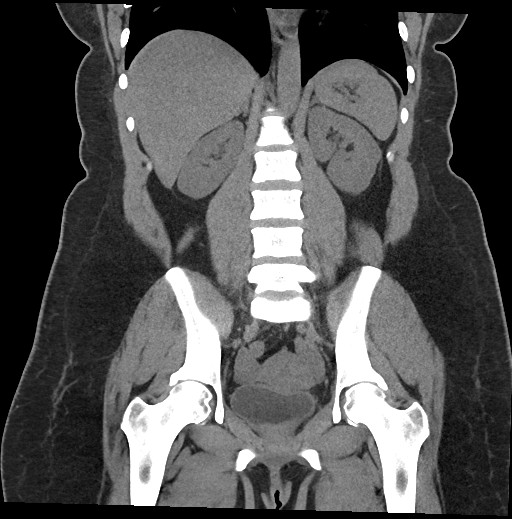
[im 66/118  soft-tissue]
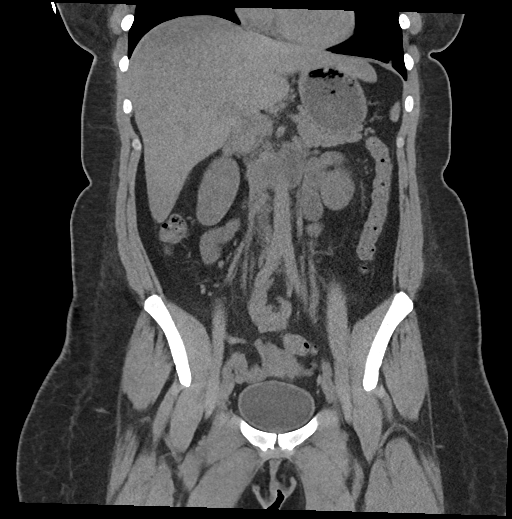

[17 of 46 positions shown; findings below may reference images not displayed]

FINDINGS: Lower Chest: Normal.

Hepatobiliary: Normal hepatic contours. No intra- or extrahepatic
biliary dilatation. There is cholelithiasis without acute
inflammation.

Pancreas: Normal pancreas. No ductal dilatation or peripancreatic
fluid collection.

Spleen: Normal.

Adrenals/Urinary Tract: The adrenal glands are normal. No
hydronephrosis, nephroureterolithiasis or solid renal mass. The
urinary bladder is normal for degree of distention

Stomach/Bowel: There is no hiatal hernia. Normal duodenal course and
caliber. No small bowel dilatation or inflammation. No focal colonic
abnormality. Normal appendix.

Vascular/Lymphatic: Normal course and caliber of the major abdominal
vessels. No abdominal or pelvic lymphadenopathy.

Reproductive: Normal uterus. No adnexal mass.

Other: None.

Musculoskeletal: No bony spinal canal stenosis or focal osseous
abnormality.
IMPRESSION: 1. No acute abnormality of the abdomen or pelvis.
2. Cholelithiasis without acute inflammation.

## 2023-07-20 ENCOUNTER — Telehealth: Payer: Medicaid Other | Admitting: Family Medicine

## 2023-07-20 DIAGNOSIS — B9689 Other specified bacterial agents as the cause of diseases classified elsewhere: Secondary | ICD-10-CM

## 2023-07-20 DIAGNOSIS — J019 Acute sinusitis, unspecified: Secondary | ICD-10-CM | POA: Diagnosis not present

## 2023-07-20 MED ORDER — AMOXICILLIN-POT CLAVULANATE 875-125 MG PO TABS: 1.0000 | ORAL_TABLET | Freq: Two times a day (BID) | ORAL | 0 refills | Status: AC

## 2023-07-20 NOTE — Progress Notes (Signed)

## 2023-07-21 ENCOUNTER — Telehealth: Payer: Medicaid Other | Admitting: Family Medicine

## 2023-07-21 NOTE — Progress Notes (Signed)
 The patient no-showed for appointment despite this provider sending direct link, reaching out via phone with no response and waiting for at least 10 minutes from appointment time for patient to join. They will be marked as a NS for this appointment/time.   Freddy Finner, NP

## 2023-09-14 ENCOUNTER — Other Ambulatory Visit: Payer: Self-pay

## 2023-09-14 ENCOUNTER — Ambulatory Visit: Admitting: Family Medicine

## 2023-09-14 VITALS — BP 124/80 | HR 97 | Ht 65.0 in | Wt 246.2 lb

## 2023-09-14 DIAGNOSIS — M25561 Pain in right knee: Secondary | ICD-10-CM

## 2023-09-14 DIAGNOSIS — G8929 Other chronic pain: Secondary | ICD-10-CM

## 2023-09-14 DIAGNOSIS — M79661 Pain in right lower leg: Secondary | ICD-10-CM | POA: Diagnosis not present

## 2023-09-14 MED ORDER — NAPROXEN 500 MG PO TABS: 500.0000 mg | ORAL_TABLET | Freq: Two times a day (BID) | ORAL | 1 refills | Status: DC

## 2023-09-14 NOTE — Patient Instructions (Addendum)
 Thank you for coming in today.   I've referred you to Physical Therapy.  Let us know if you don't hear from them in one week.   Ok to use naproxen with tylenol but not with ibuprofen or aleve.   Recheck in 6 weeks.

## 2023-09-14 NOTE — Progress Notes (Signed)
 I, Sherry [name] am a scribe for Dr. Clementeen Graham, MD.  Sherry [name] is a 37 y.o. female who presents to [facility] at [facility] today for R knee pain 8/10. It is stiff as well.Some times there is pain in the back of knee near calf. Pt was seen for this issue in June of 2024 at [facility]. An old injury 2023. Pt locates pain to lateral side of the leg. Wants to discuss getting a smaller brace and refill of pain medication.   She had a significant knee injury and was diagnosed with an LCL tear in 2024 on an MRI.  She did not have surgery for this and has been using a hinged knee brace.  The main pain today is in the posterior lateral calf.  R Knee swelling: no Mechanical symptoms:  Radiates: no Aggravates: sitting too long makes it cramp up Treatments tried: Naproxen(ran out of medication), liquid tylenol   Dx imaging: 08/31/22 R knee XR  08/27/22 R knee MRI 05/22/21 R knee MRI  05/06/21 R knee XR  Pertinent review of systems: No fevers or chills  Relevant historical information: Hypertension   Exam:  BP 124/80   Pulse 97   Ht 5\' 5"  (1.651 m)   Wt 246 lb 3.2 oz (111.7 kg)   SpO2 97%   BMI 40.97 kg/m  General: Well Developed, well nourished, and in no acute distress.   MSK: Right knee mild effusion normal-appearing otherwise. Decreased range of motion lacks flexion beyond 90 degrees. Nontender into the knee. No frank laxity felt with LCL stress test. Intact to flexion and extension.  Right calf normal appearing Sensation is intact. Strength is intact patient does have intact foot dorsiflexion and plantarflexion. Tender palpation posterior lateral calf gastrocnemius muscle belly and muscle tendinous junction area.   Lab and Radiology Results  Diagnostic Limited MSK Ultrasound of: Right calf No definitive tear or abnormality seen at the lateral calf gastrocnemius muscle and muscle tendinous junction. Impression: Normal-appearing  musculature right calf.   EXAM: MRI OF THE RIGHT KNEE WITHOUT CONTRAST  TECHNIQUE: Multiplanar, multisequence MR imaging of the knee was performed. No intravenous contrast was administered.  COMPARISON:  MRI 05/22/2021  FINDINGS: MENISCI  Medial meniscus:  Intact.  Lateral meniscus:  Intact.  LIGAMENTS  Cruciates: Intact ACL and PCL.  Collaterals: Intact MCL. Chronic complete avulsion of the fibular collateral ligament from its distal insertion. Chronic partial tear of the biceps femoris tendon from its distal insertion. Intact IT band.  CARTILAGE  Patellofemoral:  No chondral defect.  Medial:  No chondral defect.  Lateral:  No chondral defect.  MISCELLANEOUS  Joint:  No joint effusion. Fat pads within normal limits.  Popliteal Fossa:  No Baker's cyst. Intact popliteus tendon.  Extensor Mechanism:  Intact quadriceps and patellar tendons.  Bones: No acute fracture. No dislocation. No bone marrow edema. No marrow replacing bone lesion.  Other: Trace prepatellar fluid/edema.  IMPRESSION: 1. 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 No findings of new/recent injury of the right knee. 2. Intact menisci.   Electronically Signed   By: Duanne Guess D.O.   On: 08/27/2022 08:39 Procedure Note  Plundo, Lenda Kelp - 08/27/2022 Formatting of this note might be different from the original. CLINICAL DATA:  Knee pain.  Injury in October of 2022  EXAM: MRI OF THE RIGHT KNEE WITHOUT CONTRAST  TECHNIQUE: Multiplanar, multisequence MR imaging of the knee was performed. No intravenous contrast was administered.  COMPARISON:  MRI 05/22/2021  FINDINGS: MENISCI  Medial meniscus:  Intact.  Lateral meniscus:  Intact.  LIGAMENTS  Cruciates: Intact ACL and PCL.  Collaterals: Intact MCL. Chronic complete avulsion of the fibular collateral ligament from its distal insertion. Chronic partial tear of the biceps femoris tendon from its  distal insertion. Intact IT band.  CARTILAGE  Patellofemoral:  No chondral defect.  Medial:  No chondral defect.  Lateral:  No chondral defect.  MISCELLANEOUS  Joint:  No joint effusion. Fat pads within normal limits.  Popliteal Fossa:  No Baker's cyst. Intact popliteus tendon.  Extensor Mechanism:  Intact quadriceps and patellar tendons.  Bones: No acute fracture. No dislocation. No bone marrow edema. No marrow replacing bone lesion.  Other: Trace prepatellar fluid/edema.  IMPRESSION: 1. 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 No findings of new/recent injury of the right knee. 2. Intact menisci.   Electronically Signed   By: Duanne Guess D.O.   On: 08/27/2022 08:39  Exam End: 08/26/22 11:10      Assessment and Plan: 38 y.o. female with right knee and calf pain.  This is a chronic ongoing issue.  Does have an LCL injury based on the MRI from February 2024.  She does not have severe laxity currently but is having more calf pain.  She is a good candidate for retrial of PT.  Plan to refer back to PT.  I did dispense her a new hinged knee brace as her old one is no longer fitting appropriately.  Additionally will use naproxen for pain control.  Cautioned against using it every day forever and is NSAIDs are not safe for long-term use. Recheck in 6 weeks.  PDMP not reviewed this encounter. Orders Placed This Encounter  Procedures   [place] LIMITED JOINT SPACE STRUCTURES LOW RIGHT(NO LINKED CHARGES)    Reason for Exam (SYMPTOM  OR DIAGNOSIS REQUIRED):   right knee pain    Preferred imaging location?:   [facility]   Ambulatory referral to Physical Therapy    Referral Priority:   Routine    Referral Type:   Physical Medicine    Referral Reason:   Specialty Services Required    Requested Specialty:   Physical Therapy    Number of Visits Requested:   1   Meds ordered this encounter  Medications   naproxen (NAPROSYN) 500 MG  tablet    Sig: Take 1 tablet (500 mg total) by mouth 2 (two) times daily with a meal.    Dispense:  60 tablet    Refill:  1     Discussed warning signs or symptoms. Please see discharge instructions. Patient expresses understanding.   The above documentation has been reviewed and is accurate and complete Clementeen Graham, M.D.

## 2023-09-21 ENCOUNTER — Telehealth: Payer: Self-pay | Admitting: Family Medicine

## 2023-09-21 NOTE — Telephone Encounter (Signed)
 CRM received from Primary Care call center stating:  Reason for CRM: [facility] recieved a discharge paper for patient. She stated that patient is scheduled for March 17. She wants to know if patient still need to be seen for right knee.   Please advise.

## 2023-09-22 ENCOUNTER — Other Ambulatory Visit: Payer: Self-pay

## 2023-09-22 DIAGNOSIS — G8929 Other chronic pain: Secondary | ICD-10-CM

## 2023-10-05 ENCOUNTER — Telehealth: Admitting: Family Medicine

## 2023-10-05 DIAGNOSIS — B9789 Other viral agents as the cause of diseases classified elsewhere: Secondary | ICD-10-CM | POA: Diagnosis not present

## 2023-10-05 DIAGNOSIS — J019 Acute sinusitis, unspecified: Secondary | ICD-10-CM

## 2023-10-05 MED ORDER — IPRATROPIUM BROMIDE 0.03 % NA SOLN: 2.0000 | Freq: Two times a day (BID) | NASAL | 0 refills | Status: AC

## 2023-10-05 NOTE — Progress Notes (Signed)
 E-Visit for Sinus Problems  We are sorry that you are not feeling well.  Here is how we plan to help!  Based on what you have shared with me it looks like you have sinusitis.  Sinusitis is inflammation and infection in the sinus cavities of the head.  Based on your presentation I believe you most likely have Acute Viral Sinusitis.This is an infection most likely caused by a virus. There is not specific treatment for viral sinusitis other than to help you with the symptoms until the infection runs its course.  You may use an oral decongestant such as Mucinex D or if you have glaucoma or high blood pressure use plain Mucinex. Saline nasal spray help and can safely be used as often as needed for congestion, I have prescribed: Ipratropium Bromide nasal spray 0.03% 2 sprays in eah nostril 2-3 times a day.  I recommend starting OTC Xyzal as well. Start warm compresses to the eye to promote drainage. If that is not clearing up over the next couple of days or becomes an all-day long thing, let us know ASAP.  Some authorities believe that zinc sprays or the use of Echinacea may shorten the course of your symptoms.  Sinus infections are not as easily transmitted as other respiratory infection, however we still recommend that you avoid close contact with loved ones, especially the very young and elderly.  Remember to wash your hands thoroughly throughout the day as this is the number one way to prevent the spread of infection!  Home Care: Only take medications as instructed by your medical team. Do not take these medications with alcohol. A steam or ultrasonic humidifier can help congestion.  You can place a towel over your head and breathe in the steam from hot water coming from a faucet. Avoid close contacts especially the very young and the elderly. Cover your mouth when you cough or sneeze. Always remember to wash your hands.  Get Help Right Away If: You develop worsening fever or sinus pain. You  develop a severe head ache or visual changes. Your symptoms persist after you have completed your treatment plan.  Make sure you Understand these instructions. Will watch your condition. Will get help right away if you are not doing well or get worse.   Thank you for choosing an e-visit.  Your e-visit answers were reviewed by a board certified advanced clinical practitioner to complete your personal care plan. Depending upon the condition, your plan could have included both over the counter or prescription medications.  Please review your pharmacy choice. Make sure the pharmacy is open so you can pick up prescription now. If there is a problem, you may contact your provider through [employer] and have the prescription routed to another pharmacy.  Your safety is important to [place]. If you have drug allergies check your prescription carefully.   For the next 24 hours you can use MyChart to ask questions about today's visit, request a non-urgent call back, or ask for a work or school excuse. You will get an email in the next two days asking about your experience. I hope that your e-visit has been valuable and will speed your recovery.

## 2023-10-05 NOTE — Progress Notes (Signed)
 I have spent 5 minutes in review of e-visit questionnaire, review and updating patient chart, medical decision making and response to patient.   [name], PA-C

## 2023-10-11 ENCOUNTER — Telehealth: Payer: Self-pay | Admitting: Family Medicine

## 2023-10-11 DIAGNOSIS — G8929 Other chronic pain: Secondary | ICD-10-CM

## 2023-10-11 NOTE — Telephone Encounter (Signed)
 Patient called asking if she could change her physical therapy referral location to our office. She was seeing [facility] and has had two visits.  Please advise.

## 2023-10-11 NOTE — Telephone Encounter (Signed)
 Order placed for PT and [facility] [place].

## 2023-10-20 ENCOUNTER — Telehealth: Payer: Self-pay | Admitting: Family Medicine

## 2023-10-20 DIAGNOSIS — G8929 Other chronic pain: Secondary | ICD-10-CM

## 2023-10-20 NOTE — Telephone Encounter (Signed)
 Referral placed for PT with [facility] in [place].

## 2023-10-20 NOTE — Telephone Encounter (Signed)
 Patient wants to see if she can change her physical therapy location. She has started PT and has been to about 4 sessions but does not want to go back to that location. She would like to go to [facility] in [place].

## 2023-10-27 NOTE — Progress Notes (Deleted)
   Joanna Muck, PhD, LAT, ATC acting as a scribe for Garlan Juniper, MD.  Sherry [name] is a 38 y.o. female who presents to [facility] at [facility] today for f/u R knee and calf pain. Pt was last seen by Dr. Alease Hunter on 09/14/23  and was advised to use naproxen, but cautioned against every day use. She was also referred back to [facility].  Today, pt reports ***  Dx imaging: 08/31/22 R knee XR             08/27/22 R knee MRI 05/22/21 R knee MRI             05/06/21 R knee XR  Pertinent review of systems: ***  Relevant historical information: ***   Exam:  There were no vitals taken for this visit. General: Well Developed, well nourished, and in no acute distress.   MSK: ***    Lab and Radiology Results No results found for this or any previous visit (from the past 72 hours). No results found.     Assessment and Plan: 38 y.o. female with ***   PDMP not reviewed this encounter. No orders of the defined types were placed in this encounter.  No orders of the defined types were placed in this encounter.    Discussed warning signs or symptoms. Please see discharge instructions. Patient expresses understanding.   ***

## 2023-10-31 ENCOUNTER — Ambulatory Visit: Admitting: Family Medicine

## 2023-11-07 ENCOUNTER — Telehealth: Admitting: Nurse Practitioner

## 2023-11-07 ENCOUNTER — Other Ambulatory Visit: Payer: Self-pay

## 2023-11-07 ENCOUNTER — Ambulatory Visit (INDEPENDENT_AMBULATORY_CARE_PROVIDER_SITE_OTHER): Admitting: Family Medicine

## 2023-11-07 ENCOUNTER — Encounter: Payer: Self-pay | Admitting: Family Medicine

## 2023-11-07 VITALS — BP 122/84 | HR 98 | Ht 65.0 in | Wt 246.0 lb

## 2023-11-07 DIAGNOSIS — M79661 Pain in right lower leg: Secondary | ICD-10-CM | POA: Diagnosis not present

## 2023-11-07 DIAGNOSIS — M25561 Pain in right knee: Secondary | ICD-10-CM

## 2023-11-07 DIAGNOSIS — J40 Bronchitis, not specified as acute or chronic: Secondary | ICD-10-CM | POA: Diagnosis not present

## 2023-11-07 DIAGNOSIS — G8929 Other chronic pain: Secondary | ICD-10-CM | POA: Diagnosis not present

## 2023-11-07 MED ORDER — PREDNISONE 20 MG PO TABS: 20.0000 mg | ORAL_TABLET | Freq: Two times a day (BID) | ORAL | 0 refills | Status: AC

## 2023-11-07 MED ORDER — AZITHROMYCIN 250 MG PO TABS: ORAL_TABLET | ORAL | 0 refills | Status: AC

## 2023-11-07 NOTE — Progress Notes (Signed)
 E-Visit for Cough   We are sorry that you are not feeling well.  Here is how we plan to help!  Based on your presentation I believe you most likely have A cough due to bacteria.  When patients have a fever and a productive cough with a change in color or increased sputum production, we are concerned about bacterial bronchitis.  If left untreated it can progress to pneumonia.  If your symptoms do not improve with your treatment plan it is important that you contact your provider.   I have prescribed Azithromyin 250 mg: two tablets now and then one tablet daily for 4 additonal days    In addition you may use A non-prescription cough medication called Mucinex DM: take 2 tablets every 12 hours.  Prednisone 20 mg twice daily for 5 days (take with food)  From your responses in the eVisit questionnaire you describe inflammation in the upper respiratory tract which is causing a significant cough.  This is commonly called Bronchitis and has four common causes:   Allergies Viral Infections Acid Reflux Bacterial Infection Allergies, viruses and acid reflux are treated by controlling symptoms or eliminating the cause. An example might be a cough caused by taking certain blood pressure medications. You stop the cough by changing the medication. Another example might be a cough caused by acid reflux. Controlling the reflux helps control the cough.  USE OF BRONCHODILATOR ("RESCUE") INHALERS: There is a risk from using your bronchodilator too frequently.  The risk is that over-reliance on a medication which only relaxes the muscles surrounding the breathing tubes can reduce the effectiveness of medications prescribed to reduce swelling and congestion of the tubes themselves.  Although you feel brief relief from the bronchodilator inhaler, your asthma may actually be worsening with the tubes becoming more swollen and filled with mucus.  This can delay other crucial treatments, such as oral steroid medications. If  you need to use a bronchodilator inhaler daily, several times per day, you should discuss this with your provider.  There are probably better treatments that could be used to keep your asthma under control.     HOME CARE Only take medications as instructed by your medical team. Complete the entire course of an antibiotic. Drink plenty of fluids and get plenty of rest. Avoid close contacts especially the very young and the elderly Cover your mouth if you cough or cough into your sleeve. Always remember to wash your hands A steam or ultrasonic humidifier can help congestion.   GET HELP RIGHT AWAY IF: You develop worsening fever. You become short of breath You cough up blood. Your symptoms persist after you have completed your treatment plan MAKE SURE YOU  Understand these instructions. Will watch your condition. Will get help right away if you are not doing well or get worse.    Thank you for choosing an e-visit.  Your e-visit answers were reviewed by a board certified advanced clinical practitioner to complete your personal care plan. Depending upon the condition, your plan could have included both over the counter or prescription medications.  Please review your pharmacy choice. Make sure the pharmacy is open so you can pick up prescription now. If there is a problem, you may contact your provider through [employer] and have the prescription routed to another pharmacy.  Your safety is important to us . If you have drug allergies check your prescription carefully.   For the next 24 hours you can use MyChart to ask questions about today's  visit, request a non-urgent call back, or ask for a work or school excuse. You will get an email in the next two days asking about your experience. I hope that your e-visit has been valuable and will speed your recovery.  I spent approximately 5 minutes reviewing the patient's history, current symptoms and coordinating their care today.

## 2023-11-07 NOTE — Progress Notes (Unsigned)
 I, Sherry [name], CMA acting as a scribe for Sherry Juniper, MD.  Sherry [name] is a 38 y.o. female who presents to [facility] at [facility] today for f/u R knee and calf pain. Pt was last seen by Dr. Alease Hunter on 09/14/23  and was advised to use naproxen , but cautioned against every day use. She was also referred back to [facility].  Today, pt reports some improvement of knee sx with PT, was able to get some relief from pressure and tightness at posterior aspect of the knee. Had to switch offices to be closer to home. Stiffness in the calf has improved. Continues wearing knee brace. Taking Tylenol  and Naproxen   prn. Denies new injury. Feels that she would benefit from continued PT to work on ROM and strengthening.   Dx imaging: 08/31/22 R knee XR             08/27/22 R knee MRI 05/22/21 R knee MRI             05/06/21 R knee XR   Pertinent review of systems: No fevers or chills  Relevant historical information: History of depression.   Exam:  BP 122/84   Pulse 98   Ht 5\' 5"  (1.651 m)   Wt 246 lb (111.6 kg)   SpO2 98%   BMI 40.94 kg/m  General: Well Developed, well nourished, and in no acute distress.   MSK: Right knee normal-appearing Normal motion. Nontender to palpation. Some laxity with LCL stress test.  Intact strength.    Lab and Radiology Results  EXAM: MRI OF THE RIGHT KNEE WITHOUT CONTRAST   TECHNIQUE: Multiplanar, multisequence MR imaging of the knee was performed. No intravenous contrast was administered.   COMPARISON:  Right knee x-rays dated April 16, 2021.   FINDINGS: MENISCI   Medial meniscus:  Intact.   Lateral meniscus:  Intact.   LIGAMENTS   Cruciates: Thickening, irregularity, and laxity of the central ACL, consistent with partial tear. Thickening, irregularity, and increased signal of the proximal PCL, consistent with partial tear.   Collaterals: Complete avulsion of the fibular collateral ligament and biceps femoral spur  tendon from the fibular head. Partial tear of the IT band just proximal to its tibia insertion.   CARTILAGE   Patellofemoral:  Normal.   Medial:  Normal.   Lateral:  Normal.   Joint:  Moderate joint effusion.  Normal Hoffa's fat.   Popliteal Fossa: No Baker cyst. Intact popliteus tendon. Prominent edema within the popliteus muscle with partial tear of the myotendinous junction.   Extensor Mechanism: Intact quadriceps tendon and patellar tendon. Intact medial and lateral patellar retinaculum. Intact MPFL.   Bones: Avulsion fracture of the tip of the fibular head. Mild contusions in the peripheral medial and lateral femoral condyles. No dislocation. No suspicious bone lesion.   Other: None.   IMPRESSION: 1. Complete avulsion of the fibular collateral ligament and biceps femora. Tendon from the fibular head. 2. Partial tear of the IT band just proximal to its tibia insertion. 3. Partial tears of the ACL and PCL. 4. Avulsion fracture of the tip of the fibular head. 5. Severe popliteus muscle injury with partial tear of the myotendinous junction. 6. Moderate joint effusion.     Electronically Signed   By: Aleta Anda M.D.   On: 05/22/2021 18:01 I, Sherry [name], personally (independently) visualized and performed the interpretation of the images attached in this note.  EXAM:  MRI OF THE RIGHT KNEE WITHOUT CONTRAST  TECHNIQUE:  Multiplanar, multisequence MR imaging of the knee was performed. No  intravenous contrast was administered.   COMPARISON:  MRI 05/22/2021   FINDINGS:  MENISCI   Medial meniscus:  Intact.   Lateral meniscus:  Intact.   LIGAMENTS   Cruciates: Intact ACL and PCL.   Collaterals: Intact MCL. Chronic complete avulsion of the fibular  collateral ligament from its distal insertion. Chronic partial tear  of the biceps femoris tendon from its distal insertion. Intact IT  band.   CARTILAGE   Patellofemoral: No chondral defect.   Medial:   No chondral defect.   Lateral:  No chondral defect.   MISCELLANEOUS   Joint: No joint effusion. Fat pads within normal limits.   Popliteal Fossa:  No Baker's cyst. Intact popliteus tendon.   Extensor Mechanism:  Intact quadriceps and patellar tendons.   Bones: No acute fracture. No dislocation. No bone marrow edema. No  marrow replacing bone lesion.   Other: Trace prepatellar fluid/edema.   IMPRESSION:  1. 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 No  findings of new/recent injury of the right knee.  2. Intact menisci.    Electronically Signed    By: Leverne Reading D.O.    On: 08/27/2022 08:39    Assessment and Plan: 38 y.o. female with right knee pain and discomfort.  She suffered an LCL tear and partial ACL P and PCL tear in November 2022.  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  She has improved her functional ability with physical therapy.  She does require a hinged knee brace.  In my opinion this is good but not her best option.  If she cannot get to a okay enough position with PT I do think that another surgical consultation is the way to go.  Her knee is fundamentally not stable.  She is improving with PT which is helpful and may be successful enough for her.   PDMP not reviewed this encounter. No orders of the defined types were placed in this encounter.  No orders of the defined types were placed in this encounter.    Discussed warning signs or symptoms. Please see discharge instructions. Patient expresses understanding.   The above documentation has been reviewed and is accurate and complete Sherry [name], M.D.

## 2023-11-08 NOTE — Patient Instructions (Signed)
 Thank you for coming in today.   Lets give PT another month.  Check back in about a month.

## 2023-11-13 ENCOUNTER — Emergency Department (HOSPITAL_COMMUNITY)
Admission: EM | Admit: 2023-11-13 | Discharge: 2023-11-13 | Disposition: A | Discharge status: Home / Self Care | Attending: Emergency Medicine | Admitting: Emergency Medicine

## 2023-11-13 ENCOUNTER — Encounter (HOSPITAL_COMMUNITY): Payer: Self-pay

## 2023-11-13 DIAGNOSIS — E876 Hypokalemia: Secondary | ICD-10-CM

## 2023-11-13 DIAGNOSIS — R5383 Other fatigue: Secondary | ICD-10-CM | POA: Diagnosis present

## 2023-11-13 DIAGNOSIS — R6889 Other general symptoms and signs: Secondary | ICD-10-CM

## 2023-11-13 LAB — COMPREHENSIVE METABOLIC PANEL WITH GFR
ALT: 24 U/L (ref 0–44)
AST: 25 U/L (ref 15–41)
Albumin: 3.7 g/dL (ref 3.5–5.0)
Alkaline Phosphatase: 87 U/L (ref 38–126)
Anion gap: 8 (ref 5–15)
BUN: 10 mg/dL (ref 6–20)
CO2: 27 mmol/L (ref 22–32)
Calcium: 8.8 mg/dL — ABNORMAL LOW (ref 8.9–10.3)
Chloride: 99 mmol/L (ref 98–111)
Creatinine, Ser: 0.81 mg/dL (ref 0.44–1.00)
GFR, Estimated: >60 mL/min (ref >60)
Glucose, Bld: 201 mg/dL — ABNORMAL HIGH (ref 70–99)
Potassium: 3.1 mmol/L — ABNORMAL LOW (ref 3.5–5.1)
Sodium: 134 mmol/L — ABNORMAL LOW (ref 135–145)
Total Bilirubin: 0.4 mg/dL (ref 0.0–1.2)
Total Protein: 8.6 g/dL — ABNORMAL HIGH (ref 6.5–8.1)

## 2023-11-13 LAB — CBC WITH DIFFERENTIAL/PLATELET
Abs Immature Granulocytes: 0.03 10*3/uL (ref 0.00–0.07)
Basophils Absolute: 0.1 10*3/uL (ref 0.0–0.1)
Basophils Relative: 0 %
Eosinophils Absolute: 0.1 10*3/uL (ref 0.0–0.5)
Eosinophils Relative: 1 %
HCT: 38.7 % (ref 36.0–46.0)
Hemoglobin: 12.3 g/dL (ref 12.0–15.0)
Immature Granulocytes: 0 %
Lymphocytes Relative: 32 %
Lymphs Abs: 3.7 10*3/uL (ref 0.7–4.0)
MCH: 24.7 pg — ABNORMAL LOW (ref 26.0–34.0)
MCHC: 31.8 g/dL (ref 30.0–36.0)
MCV: 77.7 fL — ABNORMAL LOW (ref 80.0–100.0)
Monocytes Absolute: 0.7 10*3/uL (ref 0.1–1.0)
Monocytes Relative: 6 %
Neutro Abs: 7.1 10*3/uL (ref 1.7–7.7)
Neutrophils Relative %: 61 %
Platelets: 421 10*3/uL — ABNORMAL HIGH (ref 150–400)
RBC: 4.98 MIL/uL (ref 3.87–5.11)
RDW: 16.7 % — ABNORMAL HIGH (ref 11.5–15.5)
WBC: 11.7 10*3/uL — ABNORMAL HIGH (ref 4.0–10.5)
nRBC: 0 % (ref 0.0–0.2)

## 2023-11-13 LAB — TSH: TSH: 1.77 u[IU]/mL (ref 0.350–4.500)

## 2023-11-13 LAB — HCG, SERUM, QUALITATIVE: Preg, Serum: NEGATIVE

## 2023-11-13 MED ORDER — POTASSIUM CHLORIDE CRYS ER 20 MEQ PO TBCR
40.0000 meq | EXTENDED_RELEASE_TABLET | Freq: Once | ORAL | Status: AC
  Administered 2023-11-13: 40 meq via ORAL
  Filled 2023-11-13: qty 2

## 2023-11-13 NOTE — ED Provider Notes (Signed)
 [place] EMERGENCY DEPARTMENT AT [facility] Provider Note   CSN: [id number] Arrival date & time: 11/13/23  1134     History  Chief Complaint  Patient presents with   Fatigue   Hypertension    Sherry [name] is a 38 y.o. female.  38 year old female who states for the last 3 days she has felt overheated when she goes outside.  Better when she is inside.  States she gets sweaty other hands to her body when she is outside.  Denies any palpitations with this.  No prior history of same.  Is on Augmentin  for a respiratory infection.  Denies any rashes.       Home Medications Prior to Admission medications   Medication Sig Start Date End Date Taking? Authorizing Provider  acetaminophen  (LIQUID ACETAMINOPHEN ) 160 MG/5ML liquid Take by mouth. 08/09/22   [provider]  diclofenac (CATAFLAM) 50 MG tablet Take by mouth. 10/29/17   [provider]  Ferrous Sulfate Dried (SLOW RELEASE IRON) 45 MG TBCR Take by mouth. 08/09/22   [provider]  fluconazole (DIFLUCAN) 150 MG tablet Take by mouth. 07/23/22   [provider]  Fluocinolone Acetonide 0.01 % OIL 4 drops to affected ear(s) daily as needed for itching 09/24/22   [provider]  hydrochlorothiazide (HYDRODIURIL) 25 MG tablet Take 25 mg by mouth daily.    [provider]  ipratropium (ATROVENT ) 0.03 % nasal spray Place 2 sprays into both nostrils every 12 (twelve) hours. 10/05/23   Farris Hong, PA-C  IRON, FERROUS SULFATE, PO Take by mouth.    [provider]  LORYNA 3-0.02 MG tablet Take 1 tablet by mouth daily. 10/25/22   [provider]  metroNIDAZOLE  (METROGEL ) 0.75 % vaginal gel Place vaginally 2 (two) times daily. 10/05/22   [provider]  naproxen  (NAPROSYN ) 500 MG tablet Take 1 tablet (500 mg total) by mouth 2 (two) times daily with a meal. 09/14/23   Syliva Even, MD  sertraline  (ZOLOFT ) 50 MG tablet Take 1 tablet (50 mg total) by  mouth daily. 08/17/22 08/17/23  Nwoko, Uchenna E, PA  traZODone  (DESYREL ) 50 MG tablet Take 1 tablet (50 mg total) by mouth at bedtime. 08/17/22   Nwoko, Uchenna E, PA      Allergies    Fish allergy and Shellfish allergy    Review of Systems   Review of Systems  All other systems reviewed and are negative.   Physical Exam Updated Vital Signs BP (!) 146/106 (BP Location: Left Arm)   Pulse (!) 111   Temp 98.1 F (36.7 C) (Oral)   Resp 18   Ht 1.651 m (5\' 5" )   Wt 111 kg   LMP 10/26/2023   SpO2 99%   BMI 40.72 kg/m  Physical Exam Vitals and nursing note reviewed.  Constitutional:      General: She is not in acute distress.    Appearance: Normal appearance. She is well-developed. She is not toxic-appearing.  HENT:     Head: Normocephalic and atraumatic.  Eyes:     General: Lids are normal.     Conjunctiva/sclera: Conjunctivae normal.     Pupils: Pupils are equal, round, and reactive to light.  Neck:     Thyroid: No thyroid mass.     Trachea: No tracheal deviation.  Cardiovascular:     Rate and Rhythm: Normal rate and regular rhythm.     Heart sounds: Normal heart sounds. No murmur heard.    No gallop.  Pulmonary:     Effort: Pulmonary effort is normal. No respiratory distress.     Breath sounds: Normal breath sounds. No stridor. No decreased breath sounds, wheezing, rhonchi or rales.  Abdominal:     General: There is no distension.     Palpations: Abdomen is soft.     Tenderness: There is no abdominal tenderness. There is no rebound.  Musculoskeletal:        General: No tenderness. Normal range of motion.     Cervical back: Normal range of motion and neck supple.  Skin:    General: Skin is warm and dry.     Findings: No abrasion or rash.  Neurological:     Mental Status: She is alert and oriented to person, place, and time. Mental status is at baseline.     GCS: GCS eye subscore is 4. GCS verbal subscore is 5. GCS motor subscore is 6.     Cranial Nerves: No cranial  nerve deficit.     Sensory: No sensory deficit.     Motor: Motor function is intact.  Psychiatric:        Attention and Perception: Attention normal.        Speech: Speech normal.        Behavior: Behavior normal.    ED Results / Procedures / Treatments   Labs (all labs ordered are listed, but only abnormal results are displayed) Labs Reviewed  I-STAT CHEM 8, ED    EKG None  Radiology No results found.  Procedures Procedures    Medications Ordered in ED Medications - No data to display  ED Course/ Medical Decision Making/ A&P                                 Medical Decision Making Amount and/or Complexity of Data Reviewed Labs: ordered.  Risk Prescription drug management.   Patient's labs are overall reassuring with exception of mild hypokalemia that was treated with oral potassium. Her TSH is normal as well.  Unclear etiology of her current symptoms.  Likely heat intolerance and will discharge home       Final Clinical Impression(s) / ED Diagnoses Final diagnoses:  None    Rx / DC Orders ED Discharge Orders     None         Lind Repine, MD 11/13/23 1411

## 2023-11-13 NOTE — ED Triage Notes (Signed)
 Pt arrived reporting she has been feeling "over heated". No actually fever. Denies recent illness. However, states MD placed her on antibiotics after she told her she was feeling "over heated". States BP has been high. Known HTN. Reports compliance with medications. Denies headache, blurred vision, pain or any other symptoms at this time.

## 2023-11-13 NOTE — Discharge Instructions (Addendum)
 Your labs here today were reassuring with exception of mild low potassium.

## 2023-12-07 ENCOUNTER — Ambulatory Visit: Admitting: Family Medicine

## 2023-12-08 ENCOUNTER — Encounter: Payer: Self-pay | Admitting: Dermatology

## 2023-12-08 ENCOUNTER — Ambulatory Visit: Payer: Medicaid Other | Admitting: Dermatology

## 2023-12-08 VITALS — BP 126/83

## 2023-12-08 DIAGNOSIS — L810 Postinflammatory hyperpigmentation: Secondary | ICD-10-CM | POA: Diagnosis not present

## 2023-12-08 DIAGNOSIS — L700 Acne vulgaris: Secondary | ICD-10-CM | POA: Diagnosis not present

## 2023-12-08 MED ORDER — SAFETY SEAL MISCELLANEOUS MISC: 5 refills | Status: AC

## 2023-12-08 MED ORDER — CLINDAMYCIN PHOSPHATE 1 % EX SWAB: CUTANEOUS | 5 refills | Status: AC

## 2023-12-08 MED ORDER — TRETINOIN 0.05 % EX CREA: TOPICAL_CREAM | CUTANEOUS | 2 refills | Status: AC

## 2023-12-08 NOTE — Progress Notes (Signed)
   New Patient Visit   Subjective  Sherry [name] is a 38 y.o. female who presents for the following: New Pt - PIH - Body Acne  Patient states she has PIH located at the face that she would like to have examined. Patient reports the areas have been there for over 10 years. She reports the areas are not bothersome.Patient rates irritation 0 out of 10. She states that the areas have not spread. Patient reports she has previously been treated for these areas by [facility] dermatology and was given a prescription fade cream but unsure of the name. Patient denied Hx of bx. Patient denied family history of skin cancer(s). Pt would also like to discuss body acne treatments also.   The following portions of the chart were reviewed this encounter and updated as appropriate: medications, allergies, medical history  Review of Systems:  No other skin or systemic complaints except as noted in HPI or Assessment and Plan.  Objective  Well appearing patient in no apparent distress; mood and affect are within normal limits.   A focused examination was performed of the following areas: face, chest & back   Relevant exam findings are noted in the Assessment and Plan.               Assessment & Plan   ACNE VULGARIS w/ PIH Exam: Open comedones and inflammatory papules at the chest & back in additional to [facility] from previous acne flares on the face  Flared  - Assessment:  Patient reports acne flares on face and body, particularly on the back. Flares are cyclical, occurring primarily during menstruation. No previous prescription acne regimens. Currently not using any treatments. Given the cyclical nature and distribution of acne, hormonal influence is suspected. Patient also presents with dark spots, likely post-inflammatory hyperpigmentation from previous acne lesions.  Treatment Plan: - Rx Clindamycin  swabs - apply to affected areas of face, chest & back every morning - Rx Tretinoin  0.05% - Apply  pea size amount to face 2 nights a week on Tu/Th. In one month increase to 3 nights a week on M/W/F. For body acne mix pea size amount with 1 pump of lotion to apply to chest and back.  - Will add Spirolactone at [facility] if needed - Rx Medrock Melaxamic cream - Tranexamic acid 5% Kojic acid USP 2% Vit C usp 2.5% hyaluronic acid excp 0.1% - apply to the face daily morning and night.        No follow-ups on file.    Documentation: I have reviewed the above documentation for accuracy and completeness, and I agree with the above.   I, Shirron Louanne Roussel, CMA, am acting as scribe for [employer], DO.   Louana Roup, DO

## 2023-12-08 NOTE — Patient Instructions (Addendum)
 Dear Sherry [name],  Thank you for visiting today. Here is a summary of the key instructions:  - Medications:   - Clindamycin  swab: Apply every morning on face, chest, upper arms, and back   - Tretinoin  0.05% cream: Apply at night     - Start with 2 nights a week for 3 weeks     - Then increase to 3 nights a week     - Use a pea-sized amount for face     - Mix with moisturizer for body   - Melasmic (tranexamic acid): Apply morning and night  - Skincare Routine:   Morning:   - Wash face and body   - Apply clindamycin  swab   - Apply tranexamic acid   - Moisturize with sunscreen    Night:   - Mix tranexamic acid with lotion and tretinoin  for body  - Sun Protection: Use sunscreen daily to prevent dark spots from getting darker  - Follow-up:   - Return for follow-up appointment in 4 months   - Pictures will be taken to track progress  - Important Notes:   - It may take about 4 weeks to see improvement   - Do not get pregnant while using these medications  Please reach out if you have any questions or concerns.  Warm regards,  Dr. Louana Roup [facility]   Important Information  Due to recent changes in healthcare laws, you may see results of your pathology and/or laboratory studies on MyChart before the doctors have had a chance to review them. We understand that in some cases there may be results that are confusing or concerning to you. Please understand that not all results are received at the same time and often the doctors may need to interpret multiple results in order to provide you with the best plan of care or course of treatment. Therefore, we ask that you please give us  2 business days to thoroughly review all your results before contacting the office for clarification. Should we see a critical lab result, you will be contacted sooner.   If You Need Anything After Your Visit  If you have any questions or concerns for your doctor, please call our main line at  [phone] If no one answers, please leave a voicemail as directed and we will return your call as soon as possible. Messages left after 4 pm will be answered the following business day.   You may also send us  a message via MyChart. We typically respond to MyChart messages within 1-2 business days.  For prescription refills, please ask your pharmacy to contact our office. Our fax number is [phone].  If you have an urgent issue when the clinic is closed that cannot wait until the next business day, you can page your doctor at the number below.    Please note that while we do our best to be available for urgent issues outside of office hours, we are not available 24/7.   If you have an urgent issue and are unable to reach us , you may choose to seek medical care at your doctor's office, retail clinic, urgent care center, or emergency room.  If you have a medical emergency, please immediately call 911 or go to the emergency department. In the event of inclement weather, please call our main line at [phone] for an update on the status of any delays or closures.  Dermatology Medication Tips: Please keep the boxes that topical medications come in in order to help keep  track of the instructions about where and how to use these. Pharmacies typically print the medication instructions only on the boxes and not directly on the medication tubes.   If your medication is too expensive, please contact our office at [phone] or send us  a message through MyChart.   We are unable to tell what your co-pay for medications will be in advance as this is different depending on your insurance coverage. However, we may be able to find a substitute medication at lower cost or fill out paperwork to get insurance to cover a needed medication.   If a prior authorization is required to get your medication covered by your insurance company, please allow us  1-2 business days to complete this process.  Drug  prices often vary depending on where the prescription is filled and some pharmacies may offer cheaper prices.  The website www.goodrx.com contains coupons for medications through different pharmacies. The prices here do not account for what the cost may be with help from insurance (it may be cheaper with your insurance), but the website can give you the price if you did not use any insurance.  - You can print the associated coupon and take it with your prescription to the pharmacy.  - You may also stop by our office during regular business hours and pick up a GoodRx coupon card.  - If you need your prescription sent electronically to a different pharmacy, notify our office through [facility] or by phone at [phone]

## 2023-12-29 ENCOUNTER — Ambulatory Visit: Admitting: Family Medicine

## 2023-12-29 NOTE — Progress Notes (Deleted)
   Joanna Muck, PhD, LAT, ATC acting as a scribe for Garlan Juniper, MD.  Sherry [name] is a 38 y.o. female who presents to [facility] at [facility] today for f/u R knee and calf pain. Pt was last seen by Dr. Alease Hunter on 11/07/23 and was advised to cont PT at [facility].   Today, pt reports ***  Dx imaging: 08/31/22 R knee XR             08/27/22 R knee MRI 05/22/21 R knee MRI             05/06/21 R knee XR  Pertinent review of systems: ***  Relevant historical information: ***   Exam:  LMP 11/24/2023  General: Well Developed, well nourished, and in no acute distress.   MSK: ***    Lab and Radiology Results No results found for this or any previous visit (from the past 72 hours). No results found.     Assessment and Plan: 38 y.o. female with ***   PDMP not reviewed this encounter. No orders of the defined types were placed in this encounter.  No orders of the defined types were placed in this encounter.    Discussed warning signs or symptoms. Please see discharge instructions. Patient expresses understanding.   ***

## 2024-01-04 ENCOUNTER — Ambulatory Visit: Admitting: Family Medicine

## 2024-01-10 ENCOUNTER — Telehealth: Admitting: Physician Assistant

## 2024-01-10 DIAGNOSIS — Z760 Encounter for issue of repeat prescription: Secondary | ICD-10-CM

## 2024-01-10 MED ORDER — HYDROCHLOROTHIAZIDE 25 MG PO TABS: 25.0000 mg | ORAL_TABLET | Freq: Every day | ORAL | 0 refills | Status: DC

## 2024-01-10 NOTE — Progress Notes (Signed)
 Virtual Visit Consent   Sherry [name], you are scheduled for a virtual visit with a [facility] provider today. Just as with appointments in the office, your consent must be obtained to participate. Your consent will be active for this visit and any virtual visit you may have with one of our providers in the next 365 days. If you have a MyChart account, a copy of this consent can be sent to you electronically.  As this is a virtual visit, video technology does not allow for your provider to perform a traditional examination. This may limit your provider's ability to fully assess your condition. If your provider identifies any concerns that need to be evaluated in person or the need to arrange testing (such as labs, EKG, etc.), we will make arrangements to do so. Although advances in technology are sophisticated, we cannot ensure that it will always work on either your end or our end. If the connection with a video visit is poor, the visit may have to be switched to a telephone visit. With either a video or telephone visit, we are not always able to ensure that we have a secure connection.  By engaging in this virtual visit, you consent to the provision of healthcare and authorize for your insurance to be billed (if applicable) for the services provided during this visit. Depending on your insurance coverage, you may receive a charge related to this service.  I need to obtain your verbal consent now. Are you willing to proceed with your visit today? Sherry [name] has provided verbal consent on 01/10/2024 for a virtual visit (video or telephone). Sherry [name], [name]  Date: 01/10/2024 4:13 PM   Virtual Visit via Video Note   I, Sherry [name], connected with  Sherry [name]  ([id number], Nov 14, 1985) on 01/10/24 at  4:15 PM EDT by a video-enabled telemedicine application and verified that I am speaking with the correct person using two identifiers.  Location: Patient: Virtual Visit Location Patient:  Home Provider: Virtual Visit Location Provider: Home Office   I discussed the limitations of evaluation and management by telemedicine and the availability of in person appointments. The patient expressed understanding and agreed to proceed.    History of Present Illness: Sherry [name] is a 38 y.o. who identifies as a female who was assigned female at birth, and is being seen today for medication refill.  HPI: Medication Refill This is a chronic problem. The current episode started today. The problem has been unchanged. Pertinent negatives include no abdominal pain, anorexia, arthralgias, change in bowel habit, chest pain, chills, congestion, coughing, diaphoresis, fatigue, fever, headaches, joint swelling, myalgias, nausea, neck pain, numbness, rash, sore throat, swollen glands, urinary symptoms, vertigo, visual change, vomiting or weakness. Nothing aggravates the symptoms. She has tried nothing for the symptoms. The treatment provided significant relief.    Problems:  Patient Active Problem List   Diagnosis Date Noted   Itching of ear 09/24/2022   Anxiety 07/23/2022   Moderate episode of recurrent major depressive disorder (HCC) 07/23/2022   Severe major depression, single episode, without psychotic features (HCC) 07/21/2022   Generalized anxiety disorder 07/21/2022   Essential hypertension 07/02/2022    Allergies:  Allergies  Allergen Reactions   Fish Allergy    Shellfish Allergy    Medications:  Current Outpatient Medications:    acetaminophen  (LIQUID ACETAMINOPHEN ) 160 MG/5ML liquid, Take by mouth., Disp: , Rfl:    clindamycin  (CLEOCIN  T) 1 % SWAB, apply to affected areas of face, chest & back every morning,  Disp: 60 each, Rfl: 5   diclofenac (CATAFLAM) 50 MG tablet, Take by mouth., Disp: , Rfl:    Ferrous Sulfate Dried (SLOW RELEASE IRON) 45 MG TBCR, Take by mouth., Disp: , Rfl:    fluconazole (DIFLUCAN) 150 MG tablet, Take by mouth., Disp: , Rfl:    Fluocinolone Acetonide  0.01 % OIL, 4 drops to affected ear(s) daily as needed for itching, Disp: , Rfl:    hydrochlorothiazide (HYDRODIURIL) 25 MG tablet, Take 25 mg by mouth daily., Disp: , Rfl:    ipratropium (ATROVENT ) 0.03 % nasal spray, Place 2 sprays into both nostrils every 12 (twelve) hours., Disp: 30 mL, Rfl: 0   IRON, FERROUS SULFATE, PO, Take by mouth., Disp: , Rfl:    LORYNA 3-0.02 MG tablet, Take 1 tablet by mouth daily., Disp: , Rfl:    metroNIDAZOLE  (METROGEL ) 0.75 % vaginal gel, Place vaginally 2 (two) times daily., Disp: , Rfl:    naproxen  (NAPROSYN ) 500 MG tablet, Take 1 tablet (500 mg total) by mouth 2 (two) times daily with a meal., Disp: 60 tablet, Rfl: 1   Safety Seal Miscellaneous MISC, Medrock Melaxamic cream - Tranexamic acid 5% Kojic acid USP 2% Vit C usp 2.5% hyaluronic acid excp 0.1% - apply to the face daily morning and night., Disp: 15 g, Rfl: 5   sertraline  (ZOLOFT ) 50 MG tablet, Take 1 tablet (50 mg total) by mouth daily., Disp: 30 tablet, Rfl: 1   traZODone  (DESYREL ) 50 MG tablet, Take 1 tablet (50 mg total) by mouth at bedtime., Disp: 30 tablet, Rfl: 1   tretinoin  (RETIN-A ) 0.05 % cream, Apply pea size amount to face 2 nights a week on Tu/Th. In one month increase to 3 nights a week on M/W/F. For body acne mix pea size amount with 1 pump of lotion to apply to chest and back., Disp: 45 g, Rfl: 2  Observations/Objective: Patient is well-developed, well-nourished in no acute distress.  Resting comfortably  at home.  Head is normocephalic, atraumatic.  No labored breathing.  Speech is clear and coherent with logical content.  Patient is alert and oriented at baseline.    Assessment and Plan: 1. Encounter for medication refill (Primary)  Patient presented for refill of her blood pressure medication.  States she has had intermittent headaches and she has been out of her medication for the last 2 days which is why she has presented for evaluation.  No headache vision changes numbness  tingling shortness of breath abdominal pain chest pain at this time.  Advised patient that she will need to follow-up with her primary care provider for ongoing primary care needs and basic labs to ensure she is compliant with treatment and that her blood pressure is controlled.  She verbalized understanding all questions answered.  Patient was not in any acute or emergent distress at time of visit.  Follow Up Instructions: I discussed the assessment and treatment plan with the patient. The patient was provided an opportunity to ask questions and all were answered. The patient agreed with the plan and demonstrated an understanding of the instructions.  A copy of instructions were sent to the patient via MyChart unless otherwise noted below.    The patient was advised to call back or seek an in-person evaluation if the symptoms worsen or if the condition fails to improve as anticipated.    Sherry Shuck, PA-C

## 2024-01-10 NOTE — Patient Instructions (Signed)
 Sherry [name], thank you for joining [name], PA-C for today's virtual visit.  While this provider is not your primary care provider (PCP), if your PCP is located in our provider database this encounter information will be shared with them immediately following your visit.   A [facility] MyChart account gives you access to today's visit and all your visits, tests, and labs performed at [facility]  click here if you don't have a [facility] MyChart account or go to [email or web address]  Consent: (Patient) Sherry [name] provided verbal consent for this virtual visit at the beginning of the encounter.  Current Medications:  Current Outpatient Medications:    acetaminophen  (LIQUID ACETAMINOPHEN ) 160 MG/5ML liquid, Take by mouth., Disp: , Rfl:    clindamycin  (CLEOCIN  T) 1 % SWAB, apply to affected areas of face, chest & back every morning, Disp: 60 each, Rfl: 5   diclofenac (CATAFLAM) 50 MG tablet, Take by mouth., Disp: , Rfl:    Ferrous Sulfate Dried (SLOW RELEASE IRON) 45 MG TBCR, Take by mouth., Disp: , Rfl:    fluconazole (DIFLUCAN) 150 MG tablet, Take by mouth., Disp: , Rfl:    Fluocinolone Acetonide 0.01 % OIL, 4 drops to affected ear(s) daily as needed for itching, Disp: , Rfl:    hydrochlorothiazide (HYDRODIURIL) 25 MG tablet, Take 25 mg by mouth daily., Disp: , Rfl:    ipratropium (ATROVENT ) 0.03 % nasal spray, Place 2 sprays into both nostrils every 12 (twelve) hours., Disp: 30 mL, Rfl: 0   IRON, FERROUS SULFATE, PO, Take by mouth., Disp: , Rfl:    LORYNA 3-0.02 MG tablet, Take 1 tablet by mouth daily., Disp: , Rfl:    metroNIDAZOLE  (METROGEL ) 0.75 % vaginal gel, Place vaginally 2 (two) times daily., Disp: , Rfl:    naproxen  (NAPROSYN ) 500 MG tablet, Take 1 tablet (500 mg total) by mouth 2 (two) times daily with a meal., Disp: 60 tablet, Rfl: 1   Safety Seal Miscellaneous MISC, Medrock Melaxamic cream - Tranexamic acid 5% Kojic acid USP 2% Vit C usp 2.5%  hyaluronic acid excp 0.1% - apply to the face daily morning and night., Disp: 15 g, Rfl: 5   sertraline  (ZOLOFT ) 50 MG tablet, Take 1 tablet (50 mg total) by mouth daily., Disp: 30 tablet, Rfl: 1   traZODone  (DESYREL ) 50 MG tablet, Take 1 tablet (50 mg total) by mouth at bedtime., Disp: 30 tablet, Rfl: 1   tretinoin  (RETIN-A ) 0.05 % cream, Apply pea size amount to face 2 nights a week on Tu/Th. In one month increase to 3 nights a week on M/W/F. For body acne mix pea size amount with 1 pump of lotion to apply to chest and back., Disp: 45 g, Rfl: 2   Medications ordered in this encounter:  No orders of the defined types were placed in this encounter.    *If you need refills on other medications prior to your next appointment, please contact your pharmacy*  Follow-Up: Call back or seek an in-person evaluation if the symptoms worsen or if the condition fails to improve as anticipated.  [facility] [phone]  Other Instructions Please report to the nearest Emergency room with any worsening symptoms. Follow up with primary care provider (PCP) in 2 -3 days.    If you have been instructed to have an in-person evaluation today at a local Urgent Care facility, please use the link below. It will take you to a list of all of our available [facility] Urgent Cares,  including address, phone number and hours of operation. Please do not delay care.  [place] [facility]  If you or a family member do not have a primary care provider, use the link below to schedule a visit and establish care. When you choose a [facility] primary care physician or advanced practice provider, you gain a long-term partner in health. Find a Primary Care Provider  Learn more about [facility]'s in-office and virtual care options: [place] - Get Care Now

## 2024-01-15 ENCOUNTER — Telehealth: Admitting: Emergency Medicine

## 2024-01-15 DIAGNOSIS — H60502 Unspecified acute noninfective otitis externa, left ear: Secondary | ICD-10-CM

## 2024-01-15 MED ORDER — CIPROFLOXACIN-DEXAMETHASONE 0.3-0.1 % OT SUSP: 4.0000 [drp] | Freq: Two times a day (BID) | OTIC | 0 refills | Status: DC

## 2024-01-15 NOTE — Progress Notes (Signed)
 E Visit for Ear Pain - Swimmer's Ear  We are sorry that you are not feeling well. Here is how we plan to help!  Based on what you have shared with me it looks like you have Swimmer's Ear.  Swimmer's ear is a redness or swelling, irritation, or infection of your outer ear canal. These symptoms usually occur within a few days of swimming. Your ear canal is a tube that goes from the opening of the ear to the eardrum.  When water stays in your ear canal, germs can grow.  This is a painful condition that often happens to children and swimmers of all ages.  It is not contagious and oral antibiotics are not required to treat uncomplicated swimmer's ear.  The usual symptoms include:    Itchiness inside the ear  Redness or a sense of swelling in the ear  Pain when the ear is tugged on when pressure is placed on the ear  Pus draining from the infected ear    I have prescribed: Ciprofloxin 0.1% and dexamethasone  0.1% otic suspension 3 drops in affected ears twice daily for 7 days  In certain cases, swimmer's ear may progress to a more serious bacterial infection of the middle or inner ear.  If you have a fever 102 and up and significantly worsening symptoms, this could indicate a more serious infection moving to the middle/inner and needs face to face evaluation in an office by a provider.  Your symptoms should improve over the next 3 days and should resolve in about 7 days.  Be sure to complete ALL of your prescription.  HOME CARE: Wash your hands frequently. If you are prescribed an ear drop, do not place the tip of the bottle on your ear or touch it with your fingers. You can take Acetaminophen  650 mg every 4-6 hours as needed for pain.  If pain is severe or moderate, you can apply a heating pad (set on low) or hot water bottle (wrapped in a towel) to outer ear for 20 minutes.  This will also increase drainage. Avoid ear plugs Do not go swimming until the symptoms are gone Do not use Q-tips After  showers, help the water run out by tilting your head to one side.   GET HELP RIGHT AWAY IF: Fever is over 102.2 degrees. You develop progressive ear pain or hearing loss. Ear symptoms persist longer than 3 days after treatment.  MAKE SURE YOU: Understand these instructions. Will watch your condition. Will get help right away if you are not doing well or get worse.  TO PREVENT SWIMMER'S EAR: Use a bathing cap or custom fitted swim molds to keep your ears dry. Towel off after swimming to dry your ears. Tilt your head or pull your earlobes to allow the water to escape your ear canal. If there is still water in your ears, consider using a hairdryer on the lowest setting.  Thank you for choosing an e-visit.  Your e-visit answers were reviewed by a board certified advanced clinical practitioner to complete your personal care plan. Depending upon the condition, your plan could have included both over the counter or prescription medications.  Please review your pharmacy choice. Make sure the pharmacy is open so you can pick up the prescription now. If there is a problem, you may contact your provider through [employer] and have the prescription routed to another pharmacy.  Your safety is important to us . If you have drug allergies check your prescription carefully.  For the next 24 hours you can use MyChart to ask questions about today's visit, request a non-urgent call back, or ask for a work or school excuse. You will get an email with a survey after your eVisit asking about your experience. We would appreciate your feedback. I hope that your e-visit has been valuable and will aid in your recovery.  I have spent 5 minutes in review of e-visit questionnaire, review and updating patient chart, medical decision making and response to patient.   Jon Belt, PhD, FNP-BC

## 2024-01-17 ENCOUNTER — Encounter: Payer: Self-pay | Admitting: Family Medicine

## 2024-01-21 ENCOUNTER — Telehealth

## 2024-01-21 DIAGNOSIS — J209 Acute bronchitis, unspecified: Secondary | ICD-10-CM

## 2024-01-22 MED ORDER — BENZONATATE 100 MG PO CAPS: 100.0000 mg | ORAL_CAPSULE | Freq: Three times a day (TID) | ORAL | 0 refills | Status: DC | PRN

## 2024-01-22 NOTE — Progress Notes (Signed)
E-Visit for Cough  We are sorry that you are not feeling well.  Here is how we plan to help!  Based on your presentation I believe you most likely have A cough due to a virus.  This is called viral bronchitis and is best treated by rest, plenty of fluids and control of the cough.  You may use Ibuprofen or Tylenol as directed to help your symptoms.     In addition you may use A non-prescription cough medication called Robitussin DAC. Take 2 teaspoons every 8 hours or Delsym: take 2 teaspoons every 12 hours., A non-prescription cough medication called Mucinex DM: take 2 tablets every 12 hours., and A prescription cough medication called Tessalon Perles 100mg . You may take 1-2 capsules every 8 hours as needed for your cough.  From your responses in the eVisit questionnaire you describe inflammation in the upper respiratory tract which is causing a significant cough.  This is commonly called Bronchitis and has four common causes:   Allergies Viral Infections Acid Reflux Bacterial Infection Allergies, viruses and acid reflux are treated by controlling symptoms or eliminating the cause. An example might be a cough caused by taking certain blood pressure medications. You stop the cough by changing the medication. Another example might be a cough caused by acid reflux. Controlling the reflux helps control the cough.  USE OF BRONCHODILATOR ("RESCUE") INHALERS: There is a risk from using your bronchodilator too frequently.  The risk is that over-reliance on a medication which only relaxes the muscles surrounding the breathing tubes can reduce the effectiveness of medications prescribed to reduce swelling and congestion of the tubes themselves.  Although you feel brief relief from the bronchodilator inhaler, your asthma may actually be worsening with the tubes becoming more swollen and filled with mucus.  This can delay other crucial treatments, such as oral steroid medications. If you need to use a  bronchodilator inhaler daily, several times per day, you should discuss this with your provider.  There are probably better treatments that could be used to keep your asthma under control.     HOME CARE Only take medications as instructed by your medical team. Complete the entire course of an antibiotic. Drink plenty of fluids and get plenty of rest. Avoid close contacts especially the very young and the elderly Cover your mouth if you cough or cough into your sleeve. Always remember to wash your hands A steam or ultrasonic humidifier can help congestion.   GET HELP RIGHT AWAY IF: You develop worsening fever. You become short of breath You cough up blood. Your symptoms persist after you have completed your treatment plan MAKE SURE YOU  Understand these instructions. Will watch your condition. Will get help right away if you are not doing well or get worse.    Thank you for choosing an e-visit.  Your e-visit answers were reviewed by a board certified advanced clinical practitioner to complete your personal care plan. Depending upon the condition, your plan could have included both over the counter or prescription medications.  Please review your pharmacy choice. Make sure the pharmacy is open so you can pick up prescription now. If there is a problem, you may contact your provider through [employer] and have the prescription routed to another pharmacy.  Your safety is important to [place]. If you have drug allergies check your prescription carefully.   For the next 24 hours you can use MyChart to ask questions about today's visit, request a non-urgent call back, or ask  for a work or school excuse. You will get an email in the next two days asking about your experience. I hope that your e-visit has been valuable and will speed your recovery.  Approximately 5 minutes was spent documenting and reviewing patient's chart.

## 2024-01-25 ENCOUNTER — Telehealth: Admitting: Physician Assistant

## 2024-01-25 DIAGNOSIS — Z760 Encounter for issue of repeat prescription: Secondary | ICD-10-CM

## 2024-01-25 MED ORDER — HYDROCHLOROTHIAZIDE 25 MG PO TABS: 25.0000 mg | ORAL_TABLET | Freq: Every day | ORAL | 0 refills | Status: DC

## 2024-01-25 NOTE — Patient Instructions (Signed)
 Sherry [name], thank you for joining Sherry Velma Lunger, PA-C for today's virtual visit.  While this provider is not your primary care provider (PCP), if your PCP is located in our provider database this encounter information will be shared with them immediately following your visit.   A [facility] MyChart account gives you access to today's visit and all your visits, tests, and labs performed at [facility]  click here if you don't have a [facility] MyChart account or go to [email or web address]  Consent: (Patient) Sherry [name] provided verbal consent for this virtual visit at the beginning of the encounter.  Current Medications:  Current Outpatient Medications:    acetaminophen  (LIQUID ACETAMINOPHEN ) 160 MG/5ML liquid, Take by mouth., Disp: , Rfl:    benzonatate  (TESSALON  PERLES) 100 MG capsule, Take 1 capsule (100 mg total) by mouth 3 (three) times daily as needed., Disp: 20 capsule, Rfl: 0   ciprofloxacin -dexamethasone  (CIPRODEX ) OTIC suspension, Place 4 drops into the left ear 2 (two) times daily., Disp: 7.5 mL, Rfl: 0   clindamycin  (CLEOCIN  T) 1 % SWAB, apply to affected areas of face, chest & back every morning, Disp: 60 each, Rfl: 5   diclofenac (CATAFLAM) 50 MG tablet, Take by mouth., Disp: , Rfl:    Ferrous Sulfate Dried (SLOW RELEASE IRON) 45 MG TBCR, Take by mouth., Disp: , Rfl:    fluconazole (DIFLUCAN) 150 MG tablet, Take by mouth., Disp: , Rfl:    Fluocinolone Acetonide 0.01 % OIL, 4 drops to affected ear(s) daily as needed for itching, Disp: , Rfl:    hydrochlorothiazide  (HYDRODIURIL ) 25 MG tablet, Take 1 tablet (25 mg total) by mouth daily., Disp: 14 tablet, Rfl: 0   ipratropium (ATROVENT ) 0.03 % nasal spray, Place 2 sprays into both nostrils every 12 (twelve) hours., Disp: 30 mL, Rfl: 0   IRON, FERROUS SULFATE, PO, Take by mouth., Disp: , Rfl:    LORYNA 3-0.02 MG tablet, Take 1 tablet by mouth daily., Disp: , Rfl:    metroNIDAZOLE  (METROGEL ) 0.75 %  vaginal gel, Place vaginally 2 (two) times daily., Disp: , Rfl:    naproxen  (NAPROSYN ) 500 MG tablet, Take 1 tablet (500 mg total) by mouth 2 (two) times daily with a meal., Disp: 60 tablet, Rfl: 1   Safety Seal Miscellaneous MISC, Medrock Melaxamic cream - Tranexamic acid 5% Kojic acid USP 2% Vit C usp 2.5% hyaluronic acid excp 0.1% - apply to the face daily morning and night., Disp: 15 g, Rfl: 5   sertraline  (ZOLOFT ) 50 MG tablet, Take 1 tablet (50 mg total) by mouth daily., Disp: 30 tablet, Rfl: 1   traZODone  (DESYREL ) 50 MG tablet, Take 1 tablet (50 mg total) by mouth at bedtime., Disp: 30 tablet, Rfl: 1   tretinoin  (RETIN-A ) 0.05 % cream, Apply pea size amount to face 2 nights a week on Tu/Th. In one month increase to 3 nights a week on M/W/F. For body acne mix pea size amount with 1 pump of lotion to apply to chest and back., Disp: 45 g, Rfl: 2   Medications ordered in this encounter:  No orders of the defined types were placed in this encounter.    *If you need refills on other medications prior to your next appointment, please contact your pharmacy*  Follow-Up: Call back or seek an in-person evaluation if the symptoms worsen or if the condition fails to improve as anticipated.   Virtual Care [phone]  Other Instructions I have sent in a refill of your medication.  You will need to follow-up with your new PCP or be seen at local urgent care for any further refills of your medication. Take care and have a good rest of the week!   If you have been instructed to have an in-person evaluation today at a local Urgent Care facility, please use the link below. It will take you to a list of all of our available [facility], including address, phone number and hours of operation. Please do not delay care.  [facility]  If you or a family member do not have a primary care provider, use the link below to schedule a visit and establish care. When you  choose a [facility] primary care physician or advanced practice provider, you gain a long-term partner in health. Find a Primary Care Provider  Learn more about [facility]'s in-office and virtual care options: [facility] - Get Care Now

## 2024-01-25 NOTE — Progress Notes (Signed)
 Virtual Visit Consent   Sherry [name], you are scheduled for a virtual visit with a [facility] provider today. Just as with appointments in the office, your consent must be obtained to participate. Your consent will be active for this visit and any virtual visit you may have with one of our providers in the next 365 days. If you have a MyChart account, a copy of this consent can be sent to you electronically.  As this is a virtual visit, video technology does not allow for your provider to perform a traditional examination. This may limit your provider's ability to fully assess your condition. If your provider identifies any concerns that need to be evaluated in person or the need to arrange testing (such as labs, EKG, etc.), we will make arrangements to do so. Although advances in technology are sophisticated, we cannot ensure that it will always work on either your end or our end. If the connection with a video visit is poor, the visit may have to be switched to a telephone visit. With either a video or telephone visit, we are not always able to ensure that we have a secure connection.  By engaging in this virtual visit, you consent to the provision of healthcare and authorize for your insurance to be billed (if applicable) for the services provided during this visit. Depending on your insurance coverage, you may receive a charge related to this service.  I need to obtain your verbal consent now. Are you willing to proceed with your visit today? Sherry [name] has provided verbal consent on 01/25/2024 for a virtual visit (video or telephone). Sherry [name], [name]  Date: 01/25/2024 1:40 PM   Virtual Visit via Video Note   I, Sherry [name], connected with  Sherry [name]  ([id number], 09-May-1986) on 01/25/24 at  1:45 PM EDT by a video-enabled telemedicine application and verified that I am speaking with the correct person using two identifiers.  Location: Patient: Virtual Visit Location  Patient: Home Provider: Virtual Visit Location Provider: Home Office   I discussed the limitations of evaluation and management by telemedicine and the availability of in person appointments. The patient expressed understanding and agreed to proceed.    History of Present Illness: Sherry [name] is a 38 y.o. who identifies as a female who was assigned female at birth, and is being seen today for very mild headache noted over the past day since running out of her BP medication yesterday (Hydrochlorothiazide  25 mg). Patient denies chest pain, palpitations, lightheadedness, dizziness, vision changes. Checked BP at home this morning with her phone and is within .   New PCP appointment scheduled for 8/12. Prior PCP will not refill.   HPI: HPI  Problems:  Patient Active Problem List   Diagnosis Date Noted   Itching of ear 09/24/2022   Anxiety 07/23/2022   Moderate episode of recurrent major depressive disorder (HCC) 07/23/2022   Severe major depression, single episode, without psychotic features (HCC) 07/21/2022   Generalized anxiety disorder 07/21/2022   Essential hypertension 07/02/2022    Allergies:  Allergies  Allergen Reactions   Fish Allergy    Shellfish Allergy    Medications:  Current Outpatient Medications:    acetaminophen  (LIQUID ACETAMINOPHEN ) 160 MG/5ML liquid, Take by mouth., Disp: , Rfl:    benzonatate  (TESSALON  PERLES) 100 MG capsule, Take 1 capsule (100 mg total) by mouth 3 (three) times daily as needed., Disp: 20 capsule, Rfl: 0   ciprofloxacin -dexamethasone  (CIPRODEX ) OTIC suspension, Place 4 drops into the left  ear 2 (two) times daily., Disp: 7.5 mL, Rfl: 0   clindamycin  (CLEOCIN  T) 1 % SWAB, apply to affected areas of face, chest & back every morning, Disp: 60 each, Rfl: 5   diclofenac (CATAFLAM) 50 MG tablet, Take by mouth., Disp: , Rfl:    Ferrous Sulfate Dried (SLOW RELEASE IRON) 45 MG TBCR, Take by mouth., Disp: , Rfl:    fluconazole (DIFLUCAN) 150 MG tablet,  Take by mouth., Disp: , Rfl:    Fluocinolone Acetonide 0.01 % OIL, 4 drops to affected ear(s) daily as needed for itching, Disp: , Rfl:    hydrochlorothiazide  (HYDRODIURIL ) 25 MG tablet, Take 1 tablet (25 mg total) by mouth daily., Disp: 14 tablet, Rfl: 0   ipratropium (ATROVENT ) 0.03 % nasal spray, Place 2 sprays into both nostrils every 12 (twelve) hours., Disp: 30 mL, Rfl: 0   IRON, FERROUS SULFATE, PO, Take by mouth., Disp: , Rfl:    LORYNA 3-0.02 MG tablet, Take 1 tablet by mouth daily., Disp: , Rfl:    naproxen  (NAPROSYN ) 500 MG tablet, Take 1 tablet (500 mg total) by mouth 2 (two) times daily with a meal., Disp: 60 tablet, Rfl: 1   Safety Seal Miscellaneous MISC, Medrock Melaxamic cream - Tranexamic acid 5% Kojic acid USP 2% Vit C usp 2.5% hyaluronic acid excp 0.1% - apply to the face daily morning and night., Disp: 15 g, Rfl: 5   sertraline  (ZOLOFT ) 50 MG tablet, Take 1 tablet (50 mg total) by mouth daily., Disp: 30 tablet, Rfl: 1   traZODone  (DESYREL ) 50 MG tablet, Take 1 tablet (50 mg total) by mouth at bedtime., Disp: 30 tablet, Rfl: 1   tretinoin  (RETIN-A ) 0.05 % cream, Apply pea size amount to face 2 nights a week on Tu/Th. In one month increase to 3 nights a week on M/W/F. For body acne mix pea size amount with 1 pump of lotion to apply to chest and back., Disp: 45 g, Rfl: 2  Observations/Objective: Patient is well-developed, well-nourished in no acute distress.  Resting comfortably  at home.  Head is normocephalic, atraumatic.  No labored breathing.  Speech is clear and coherent with logical content.  Patient is alert and oriented at baseline.   Assessment and Plan: 1. Encounter for medication refill (Primary)  Was given 2 week refill by our team on 7/1. Unfortunately she is between PCP and new PCP appt (verified in EPIC) is scheduled for 8/12. No alarm signs or symptoms. Will give one month supply of medication to get her to her NP appt. She is calling that PCP office to see  about any cancellations where she can have her NP appointment sooner. UC/ER precautions reviewed.  Follow Up Instructions: I discussed the assessment and treatment plan with the patient. The patient was provided an opportunity to ask questions and all were answered. The patient agreed with the plan and demonstrated an understanding of the instructions.  A copy of instructions were sent to the patient via MyChart unless otherwise noted below.   The patient was advised to call back or seek an in-person evaluation if the symptoms worsen or if the condition fails to improve as anticipated.    Sherry Velma Lunger, PA-C

## 2024-01-29 ENCOUNTER — Telehealth: Admitting: Physician Assistant

## 2024-01-29 DIAGNOSIS — R197 Diarrhea, unspecified: Secondary | ICD-10-CM | POA: Diagnosis not present

## 2024-01-29 DIAGNOSIS — U071 COVID-19: Secondary | ICD-10-CM

## 2024-01-29 DIAGNOSIS — J029 Acute pharyngitis, unspecified: Secondary | ICD-10-CM

## 2024-01-29 DIAGNOSIS — J069 Acute upper respiratory infection, unspecified: Secondary | ICD-10-CM

## 2024-01-29 MED ORDER — LOPERAMIDE HCL 2 MG PO TABS
2.0000 mg | ORAL_TABLET | Freq: Four times a day (QID) | ORAL | 0 refills | Status: DC | PRN
Start: 2024-01-29 — End: 2024-03-18

## 2024-01-29 NOTE — Progress Notes (Signed)
  Because of your multiple symptoms, I feel your condition warrants further evaluation and I recommend that you be seen in a face-to-face visit.   NOTE: There will be NO CHARGE for this E-Visit   If you are having a true medical emergency, please call 911.     For an urgent face to face visit, [facility] has multiple urgent care centers for your convenience.  Click the link below for the full list of locations and hours, walk-in wait times, appointment scheduling options and driving directions:  Urgent Care - [place], [place], [place], [place], [place], [place]  [place]     Your MyChart E-visit questionnaire answers were reviewed by a board certified advanced clinical practitioner to complete your personal care plan based on your specific symptoms.    Thank you for using e-Visits.

## 2024-01-29 NOTE — Progress Notes (Signed)
  Because of your symptoms and the duration, you were previously advised to be seen in person.  I recommend that you be seen in a face-to-face visit.   NOTE: There will be NO CHARGE for this E-Visit   If you are having a true medical emergency, please call 911.     For an urgent face to face visit, [facility] has multiple urgent care centers for your convenience.  Click the link below for the full list of locations and hours, walk-in wait times, appointment scheduling options and driving directions:  Urgent Care - [place], [place], [place], [place], [place], [place]  [place]     Your MyChart E-visit questionnaire answers were reviewed by a board certified advanced clinical practitioner to complete your personal care plan based on your specific symptoms.    Thank you for using e-Visits.

## 2024-01-29 NOTE — Progress Notes (Signed)
E-Visit for Diarrhea  We are sorry that you are not feeling well.  Here is how we plan to help!  Based on what you have shared with me it looks like you have Acute Infectious Diarrhea.  Most cases of acute diarrhea are due to infections with virus and bacteria and are self-limited conditions lasting less than 14 days.  For your symptoms you may take Imodium 2 mg tablets that are over the counter at your local pharmacy. Take two tablet now and then one after each loose stool up to 6 a day.  Antibiotics are not needed for most people with diarrhea.   HOME CARE We recommend changing your diet to help with your symptoms for the next few days. Drink plenty of fluids that contain water salt and sugar. Sports drinks such as Gatorade may help.  You may try broths, soups, bananas, applesauce, soft breads, mashed potatoes or crackers.  You are considered infectious for as long as the diarrhea continues. Hand washing or use of alcohol based hand sanitizers is recommend. It is best to stay out of work or school until your symptoms stop.   GET HELP RIGHT AWAY If you have dark yellow colored urine or do not pass urine frequently you should drink more fluids.   If your symptoms worsen  If you feel like you are going to pass out (faint) You have a new problem  MAKE SURE YOU  Understand these instructions. Will watch your condition. Will get help right away if you are not doing well or get worse.  Thank you for choosing an e-visit.  Your e-visit answers were reviewed by a board certified advanced clinical practitioner to complete your personal care plan. Depending upon the condition, your plan could have included both over the counter or prescription medications.  Please review your pharmacy choice. Make sure the pharmacy is open so you can pick up prescription now. If there is a problem, you may contact your provider through [employer] and have the prescription routed to another pharmacy.   Your safety is important to [place]. If you have drug allergies check your prescription carefully.   For the next 24 hours you can use MyChart to ask questions about today's visit, request a non-urgent call back, or ask for a work or school excuse. You will get an email in the next two days asking about your experience. I hope that your e-visit has been valuable and will speed your recovery.

## 2024-01-29 NOTE — Progress Notes (Signed)
 I have spent 5 minutes in review of e-visit questionnaire, review and updating patient chart, medical decision making and response to patient.   Laure Kidney, PA-C

## 2024-02-13 ENCOUNTER — Ambulatory Visit: Admitting: Family Medicine

## 2024-02-21 ENCOUNTER — Ambulatory Visit: Admitting: Family Medicine

## 2024-02-26 ENCOUNTER — Telehealth: Admitting: Family Medicine

## 2024-02-26 DIAGNOSIS — Z760 Encounter for issue of repeat prescription: Secondary | ICD-10-CM

## 2024-02-26 DIAGNOSIS — I159 Secondary hypertension, unspecified: Secondary | ICD-10-CM

## 2024-02-26 MED ORDER — HYDROCHLOROTHIAZIDE 25 MG PO TABS: 25.0000 mg | ORAL_TABLET | Freq: Every day | ORAL | 0 refills | Status: AC

## 2024-02-26 NOTE — Progress Notes (Signed)
 Virtual Visit Consent   Sherry [name], you are scheduled for a virtual visit with a [facility] provider today. Just as with appointments in the office, your consent must be obtained to participate. Your consent will be active for this visit and any virtual visit you may have with one of our providers in the next 365 days. If you have a MyChart account, a copy of this consent can be sent to you electronically.  As this is a virtual visit, video technology does not allow for your provider to perform a traditional examination. This may limit your provider's ability to fully assess your condition. If your provider identifies any concerns that need to be evaluated in person or the need to arrange testing (such as labs, EKG, etc.), we will make arrangements to do so. Although advances in technology are sophisticated, we cannot ensure that it will always work on either your end or our end. If the connection with a video visit is poor, the visit may have to be switched to a telephone visit. With either a video or telephone visit, we are not always able to ensure that we have a secure connection.  By engaging in this virtual visit, you consent to the provision of healthcare and authorize for your insurance to be billed (if applicable) for the services provided during this visit. Depending on your insurance coverage, you may receive a charge related to this service.  I need to obtain your verbal consent now. Are you willing to proceed with your visit today? Miquel Yeldell has provided verbal consent on 02/26/2024 for a virtual visit (video or telephone). Loa Lamp, FNP  Date: 02/26/2024 11:01 AM   Virtual Visit via Video Note   I, Loa Lamp, connected with  Aalina Brege  ([id number], 1985-10-18) on 02/26/24 at 11:00 AM EDT by a video-enabled telemedicine application and verified that I am speaking with the correct person using two identifiers.  Location: Patient: Virtual Visit Location Patient:  Home Provider: Virtual Visit Location Provider: Home Office   I discussed the limitations of evaluation and management by telemedicine and the availability of in person appointments. The patient expressed understanding and agreed to proceed.    History of Present Illness: [name] is a 38 y.o. who identifies as a female who was assigned female at birth, and is being seen today for  running out of her bp med 71. Has apptmt the 19th to see pcp but does not want to go that long without meds. She needs a refill on hydrochlorothiazide .  HPI: HPI  Problems:  Patient Active Problem List   Diagnosis Date Noted   Itching of ear 09/24/2022   Anxiety 07/23/2022   Moderate episode of recurrent major depressive disorder (HCC) 07/23/2022   Severe major depression, single episode, without psychotic features (HCC) 07/21/2022   Generalized anxiety disorder 07/21/2022   Essential hypertension 07/02/2022    Allergies:  Allergies  Allergen Reactions   Fish Allergy    Shellfish Allergy    Medications:  Current Outpatient Medications:    acetaminophen  (LIQUID ACETAMINOPHEN ) 160 MG/5ML liquid, Take by mouth., Disp: , Rfl:    benzonatate  (TESSALON  PERLES) 100 MG capsule, Take 1 capsule (100 mg total) by mouth 3 (three) times daily as needed., Disp: 20 capsule, Rfl: 0   ciprofloxacin -dexamethasone  (CIPRODEX ) OTIC suspension, Place 4 drops into the left ear 2 (two) times daily., Disp: 7.5 mL, Rfl: 0   clindamycin  (CLEOCIN  T) 1 % SWAB, apply to affected areas of face, chest & back  every morning, Disp: 60 each, Rfl: 5   diclofenac (CATAFLAM) 50 MG tablet, Take by mouth., Disp: , Rfl:    Ferrous Sulfate Dried (SLOW RELEASE IRON) 45 MG TBCR, Take by mouth., Disp: , Rfl:    fluconazole (DIFLUCAN) 150 MG tablet, Take by mouth., Disp: , Rfl:    Fluocinolone Acetonide 0.01 % OIL, 4 drops to affected ear(s) daily as needed for itching, Disp: , Rfl:    hydrochlorothiazide  (HYDRODIURIL ) 25 MG tablet, Take 1  tablet (25 mg total) by mouth daily. One-time refill. Further fills to come from new PCP, Disp: 30 tablet, Rfl: 0   ipratropium (ATROVENT ) 0.03 % nasal spray, Place 2 sprays into both nostrils every 12 (twelve) hours., Disp: 30 mL, Rfl: 0   IRON, FERROUS SULFATE, PO, Take by mouth., Disp: , Rfl:    loperamide  (IMODIUM  A-D) 2 MG tablet, Take 1 tablet (2 mg total) by mouth 4 (four) times daily as needed for diarrhea or loose stools., Disp: 30 tablet, Rfl: 0   LORYNA 3-0.02 MG tablet, Take 1 tablet by mouth daily., Disp: , Rfl:    naproxen  (NAPROSYN ) 500 MG tablet, Take 1 tablet (500 mg total) by mouth 2 (two) times daily with a meal., Disp: 60 tablet, Rfl: 1   Safety Seal Miscellaneous MISC, Medrock Melaxamic cream - Tranexamic acid 5% Kojic acid USP 2% Vit C usp 2.5% hyaluronic acid excp 0.1% - apply to the face daily morning and night., Disp: 15 g, Rfl: 5   sertraline  (ZOLOFT ) 50 MG tablet, Take 1 tablet (50 mg total) by mouth daily., Disp: 30 tablet, Rfl: 1   traZODone  (DESYREL ) 50 MG tablet, Take 1 tablet (50 mg total) by mouth at bedtime., Disp: 30 tablet, Rfl: 1   tretinoin  (RETIN-A ) 0.05 % cream, Apply pea size amount to face 2 nights a week on Tu/Th. In one month increase to 3 nights a week on M/W/F. For body acne mix pea size amount with 1 pump of lotion to apply to chest and back., Disp: 45 g, Rfl: 2  Observations/Objective: Patient is well-developed, well-nourished in no acute distress.  Resting comfortably  at home.  Head is normocephalic, atraumatic.  No labored breathing.  Speech is clear and coherent with logical content.  Patient is alert and oriented at baseline.    Assessment and Plan: 1. Secondary hypertension (Primary)  2. Encounter for medication refill - hydrochlorothiazide  (HYDRODIURIL ) 25 MG tablet; Take 1 tablet (25 mg total) by mouth daily. One-time refill. Further fills to come from new PCP  Dispense: 30 tablet; Refill: 0  Keep apptmt with pcp. UC for any chest  pain, sob, dizziness.   Follow Up Instructions: I discussed the assessment and treatment plan with the patient. The patient was provided an opportunity to ask questions and all were answered. The patient agreed with the plan and demonstrated an understanding of the instructions.  A copy of instructions were sent to the patient via MyChart unless otherwise noted below.     The patient was advised to call back or seek an in-person evaluation if the symptoms worsen or if the condition fails to improve as anticipated.    Afra Tricarico, FNP

## 2024-02-26 NOTE — Patient Instructions (Signed)
 Hypertension, Adult High blood pressure (hypertension) is when the force of blood pumping through the arteries is too strong. The arteries are the blood vessels that carry blood from the heart throughout the body. Hypertension forces the heart to work harder to pump blood and may cause arteries to become narrow or stiff. Untreated or uncontrolled hypertension can lead to a heart attack, heart failure, a stroke, kidney disease, and other problems. A blood pressure reading consists of a higher number over a lower number. Ideally, your blood pressure should be below 120/80. The first ("top") number is called the systolic pressure. It is a measure of the pressure in your arteries as your heart beats. The second ("bottom") number is called the diastolic pressure. It is a measure of the pressure in your arteries as the heart relaxes. What are the causes? The exact cause of this condition is not known. There are some conditions that result in high blood pressure. What increases the risk? Certain factors may make you more likely to develop high blood pressure. Some of these risk factors are under your control, including: Smoking. Not getting enough exercise or physical activity. Being overweight. Having too much fat, sugar, calories, or salt (sodium) in your diet. Drinking too much alcohol. Other risk factors include: Having a personal history of heart disease, diabetes, high cholesterol, or kidney disease. Stress. Having a family history of high blood pressure and high cholesterol. Having obstructive sleep apnea. Age. The risk increases with age. What are the signs or symptoms? High blood pressure may not cause symptoms. Very high blood pressure (hypertensive crisis) may cause: Headache. Fast or irregular heartbeats (palpitations). Shortness of breath. Nosebleed. Nausea and vomiting. Vision changes. Severe chest pain, dizziness, and seizures. How is this diagnosed? This condition is diagnosed by  measuring your blood pressure while you are seated, with your arm resting on a flat surface, your legs uncrossed, and your feet flat on the floor. The cuff of the blood pressure monitor will be placed directly against the skin of your upper arm at the level of your heart. Blood pressure should be measured at least twice using the same arm. Certain conditions can cause a difference in blood pressure between your right and left arms. If you have a high blood pressure reading during one visit or you have normal blood pressure with other risk factors, you may be asked to: Return on a different day to have your blood pressure checked again. Monitor your blood pressure at home for 1 week or longer. If you are diagnosed with hypertension, you may have other blood or imaging tests to help your health care provider understand your overall risk for other conditions. How is this treated? This condition is treated by making healthy lifestyle changes, such as eating healthy foods, exercising more, and reducing your alcohol intake. You may be referred for counseling on a healthy diet and physical activity. Your health care provider may prescribe medicine if lifestyle changes are not enough to get your blood pressure under control and if: Your systolic blood pressure is above 130. Your diastolic blood pressure is above 80. Your personal target blood pressure may vary depending on your medical conditions, your age, and other factors. Follow these instructions at home: Eating and drinking  Eat a diet that is high in fiber and potassium, and low in sodium, added sugar, and fat. An example of this eating plan is called the DASH diet. DASH stands for Dietary Approaches to Stop Hypertension. To eat this way: Eat  plenty of fresh fruits and vegetables. Try to fill one half of your plate at each meal with fruits and vegetables. Eat whole grains, such as whole-wheat pasta, brown rice, or whole-grain bread. Fill about one  fourth of your plate with whole grains. Eat or drink low-fat dairy products, such as skim milk or low-fat yogurt. Avoid fatty cuts of meat, processed or cured meats, and poultry with skin. Fill about one fourth of your plate with lean proteins, such as fish, chicken without skin, beans, eggs, or tofu. Avoid pre-made and processed foods. These tend to be higher in sodium, added sugar, and fat. Reduce your daily sodium intake. Many people with hypertension should eat less than 1,500 mg of sodium a day. Do not drink alcohol if: Your health care provider tells you not to drink. You are pregnant, may be pregnant, or are planning to become pregnant. If you drink alcohol: Limit how much you have to: 0-1 drink a day for women. 0-2 drinks a day for men. Know how much alcohol is in your drink. In the [place] one drink equals one 12 oz bottle of beer (355 mL), one 5 oz glass of wine (148 mL), or one 1 oz glass of hard liquor (44 mL). Lifestyle  Work with your health care provider to maintain a healthy body weight or to lose weight. Ask what an ideal weight is for you. Get at least 30 minutes of exercise that causes your heart to beat faster (aerobic exercise) most days of the week. Activities may include walking, swimming, or biking. Include exercise to strengthen your muscles (resistance exercise), such as Pilates or lifting weights, as part of your weekly exercise routine. Try to do these types of exercises for 30 minutes at least 3 days a week. Do not use any products that contain nicotine or tobacco. These products include cigarettes, chewing tobacco, and vaping devices, such as e-cigarettes. If you need help quitting, ask your health care provider. Monitor your blood pressure at home as told by your health care provider. Keep all follow-up visits. This is important. Medicines Take over-the-counter and prescription medicines only as told by your health care provider. Follow directions carefully. Blood  pressure medicines must be taken as prescribed. Do not skip doses of blood pressure medicine. Doing this puts you at risk for problems and can make the medicine less effective. Ask your health care provider about side effects or reactions to medicines that you should watch for. Contact a health care provider if you: Think you are having a reaction to a medicine you are taking. Have headaches that keep coming back (recurring). Feel dizzy. Have swelling in your ankles. Have trouble with your vision. Get help right away if you: Develop a severe headache or confusion. Have unusual weakness or numbness. Feel faint. Have severe pain in your chest or abdomen. Vomit repeatedly. Have trouble breathing. These symptoms may be an emergency. Get help right away. Call 911. Do not wait to see if the symptoms will go away. Do not drive yourself to the hospital. Summary Hypertension is when the force of blood pumping through your arteries is too strong. If this condition is not controlled, it may put you at risk for serious complications. Your personal target blood pressure may vary depending on your medical conditions, your age, and other factors. For most people, a normal blood pressure is less than 120/80. Hypertension is treated with lifestyle changes, medicines, or a combination of both. Lifestyle changes include losing weight, eating a healthy,  low-sodium diet, exercising more, and limiting alcohol. This information is not intended to replace advice given to you by your health care provider. Make sure you discuss any questions you have with your health care provider. Document Revised: 05/05/2021 Document Reviewed: 05/05/2021 Elsevier Patient Education  2024 [employer].

## 2024-03-18 ENCOUNTER — Telehealth: Admitting: Nurse Practitioner

## 2024-03-18 ENCOUNTER — Telehealth: Admitting: Family

## 2024-03-18 DIAGNOSIS — J069 Acute upper respiratory infection, unspecified: Secondary | ICD-10-CM

## 2024-03-18 DIAGNOSIS — U071 COVID-19: Secondary | ICD-10-CM

## 2024-03-18 MED ORDER — CETIRIZINE HCL 10 MG PO TABS
10.0000 mg | ORAL_TABLET | Freq: Every day | ORAL | 1 refills | Status: AC
Start: 2024-03-18 — End: ?

## 2024-03-18 MED ORDER — FLUTICASONE PROPIONATE 50 MCG/ACT NA SUSP: 2.0000 | Freq: Every day | NASAL | 6 refills | Status: AC

## 2024-03-18 MED ORDER — PROMETHAZINE-DM 6.25-15 MG/5ML PO SYRP: 5.0000 mL | ORAL_SOLUTION | Freq: Four times a day (QID) | ORAL | 0 refills | Status: AC | PRN

## 2024-03-18 NOTE — Progress Notes (Signed)
 Virtual Visit Consent   Sherry [name], you are scheduled for a virtual visit with a [facility] provider today. Just as with appointments in the office, your consent must be obtained to participate. Your consent will be active for this visit and any virtual visit you may have with one of our providers in the next 365 days. If you have a MyChart account, a copy of this consent can be sent to you electronically.  As this is a virtual visit, video technology does not allow for your provider to perform a traditional examination. This may limit your provider's ability to fully assess your condition. If your provider identifies any concerns that need to be evaluated in person or the need to arrange testing (such as labs, EKG, etc.), we will make arrangements to do so. Although advances in technology are sophisticated, we cannot ensure that it will always work on either your end or our end. If the connection with a video visit is poor, the visit may have to be switched to a telephone visit. With either a video or telephone visit, we are not always able to ensure that we have a secure connection.  By engaging in this virtual visit, you consent to the provision of healthcare and authorize for your insurance to be billed (if applicable) for the services provided during this visit. Depending on your insurance coverage, you may receive a charge related to this service.  I need to obtain your verbal consent now. Are you willing to proceed with your visit today? Sherry [name] has provided verbal consent on 03/18/2024 for a virtual visit (video or telephone). Bari Learn, FNP  Date: 03/18/2024 7:21 PM   Virtual Visit via Video Note   I, Bari Learn, connected with  Sherry [name]  ([id number], 1985/12/26) on 03/18/24 at  7:15 PM EDT by a video-enabled telemedicine application and verified that I am speaking with the correct person using two identifiers.  Location: Patient: Virtual Visit Location Patient:  Home Provider: Virtual Visit Location Provider: Home Office   I discussed the limitations of evaluation and management by telemedicine and the availability of in person appointments. The patient expressed understanding and agreed to proceed.    History of Present Illness: Sherry [name] is a 38 y.o. who identifies as a female who was assigned female at birth, and is being seen today for COVID. Reports she did a video visit earlier and was diagnosed with URI and given cough syrup. However, did a COVID test and positive.   HPI: HPI  Problems:  Patient Active Problem List   Diagnosis Date Noted   Itching of ear 09/24/2022   Anxiety 07/23/2022   Moderate episode of recurrent major depressive disorder (HCC) 07/23/2022   Severe major depression, single episode, without psychotic features (HCC) 07/21/2022   Generalized anxiety disorder 07/21/2022   Essential hypertension 07/02/2022    Allergies:  Allergies  Allergen Reactions   Fish Allergy    Shellfish Allergy    Medications:  Current Outpatient Medications:    cetirizine  (ZYRTEC  ALLERGY) 10 MG tablet, Take 1 tablet (10 mg total) by mouth daily., Disp: 90 tablet, Rfl: 1   fluticasone  (FLONASE ) 50 MCG/ACT nasal spray, Place 2 sprays into both nostrils daily., Disp: 16 g, Rfl: 6   acetaminophen  (LIQUID ACETAMINOPHEN ) 160 MG/5ML liquid, Take by mouth., Disp: , Rfl:    clindamycin  (CLEOCIN  T) 1 % SWAB, apply to affected areas of face, chest & back every morning, Disp: 60 each, Rfl: 5   hydrochlorothiazide  (HYDRODIURIL ) 25  MG tablet, Take 1 tablet (25 mg total) by mouth daily. One-time refill. Further fills to come from new PCP, Disp: 30 tablet, Rfl: 0   ipratropium (ATROVENT ) 0.03 % nasal spray, Place 2 sprays into both nostrils every 12 (twelve) hours., Disp: 30 mL, Rfl: 0   IRON, FERROUS SULFATE, PO, Take by mouth., Disp: , Rfl:    LORYNA 3-0.02 MG tablet, Take 1 tablet by mouth daily., Disp: , Rfl:    naproxen  (NAPROSYN ) 500 MG tablet,  Take 1 tablet (500 mg total) by mouth 2 (two) times daily with a meal., Disp: 60 tablet, Rfl: 1   promethazine -dextromethorphan (PROMETHAZINE -DM) 6.25-15 MG/5ML syrup, Take 5 mLs by mouth 4 (four) times daily as needed., Disp: 240 mL, Rfl: 0   Safety Seal Miscellaneous MISC, [employer]rock Melaxamic cream - Tranexamic acid 5% Kojic acid USP 2% Vit C usp 2.5% hyaluronic acid excp 0.1% - apply to the face daily morning and night., Disp: 15 g, Rfl: 5   sertraline  (ZOLOFT ) 50 MG tablet, Take 1 tablet (50 mg total) by mouth daily., Disp: 30 tablet, Rfl: 1   traZODone  (DESYREL ) 50 MG tablet, Take 1 tablet (50 mg total) by mouth at bedtime., Disp: 30 tablet, Rfl: 1   tretinoin  (RETIN-A ) 0.05 % cream, Apply pea size amount to face 2 nights a week on Tu/Th. In one month increase to 3 nights a week on M/W/F. For body acne mix pea size amount with 1 pump of lotion to apply to chest and back., Disp: 45 g, Rfl: 2  Observations/Objective: Patient is well-developed, well-nourished in no acute distress.  Resting comfortably  at home.  Head is normocephalic, atraumatic.  No labored breathing.  Speech is clear and coherent with logical content.  Patient is alert and oriented at baseline.  Slight nasal congestion  Assessment and Plan: 1. COVID-19 (Primary) - fluticasone  (FLONASE ) 50 MCG/ACT nasal spray; Place 2 sprays into both nostrils daily.  Dispense: 16 g; Refill: 6 - cetirizine  (ZYRTEC  ALLERGY) 10 MG tablet; Take 1 tablet (10 mg total) by mouth daily.  Dispense: 90 tablet; Refill: 1  COVID positive, rest, force fluids, tylenol  as needed, report any worsening symptoms such as increased shortness of breath, swelling, or continued high fevers. Mild symptoms, will hold off on antivirals.    Follow Up Instructions: I discussed the assessment and treatment plan with the patient. The patient was provided an opportunity to ask questions and all were answered. The patient agreed with the plan and demonstrated an  understanding of the instructions.  A copy of instructions were sent to the patient via MyChart unless otherwise noted below.    The patient was advised to call back or seek an in-person evaluation if the symptoms worsen or if the condition fails to improve as anticipated.    Bari Learn, FNP

## 2024-03-18 NOTE — Progress Notes (Signed)
 Virtual Visit Consent   Sherry [name], you are scheduled for a virtual visit with a [facility] provider today. Just as with appointments in the office, your consent must be obtained to participate. Your consent will be active for this visit and any virtual visit you may have with one of our providers in the next 365 days. If you have a MyChart account, a copy of this consent can be sent to you electronically.  As this is a virtual visit, video technology does not allow for your provider to perform a traditional examination. This may limit your provider's ability to fully assess your condition. If your provider identifies any concerns that need to be evaluated in person or the need to arrange testing (such as labs, EKG, etc.), we will make arrangements to do so. Although advances in technology are sophisticated, we cannot ensure that it will always work on either your end or our end. If the connection with a video visit is poor, the visit may have to be switched to a telephone visit. With either a video or telephone visit, we are not always able to ensure that we have a secure connection.  By engaging in this virtual visit, you consent to the provision of healthcare and authorize for your insurance to be billed (if applicable) for the services provided during this visit. Depending on your insurance coverage, you may receive a charge related to this service.  I need to obtain your verbal consent now. Are you willing to proceed with your visit today? Sherry [name] has provided verbal consent on 03/18/2024 for a virtual visit (video or telephone). Sherry LELON Servant, NP  Date: 03/18/2024 3:47 PM   Virtual Visit via Video Note   I, Sherry [name], connected with  Sherry [name]  ([id number], [birth year]) on 03/18/24 at  3:15 PM EDT by a video-enabled telemedicine application and verified that I am speaking with the correct person using two identifiers.  Location: Patient: Virtual Visit Location Patient:  Home Provider: Virtual Visit Location Provider: Home Office   I discussed the limitations of evaluation and management by telemedicine and the availability of in person appointments. The patient expressed understanding and agreed to proceed.    History of Present Illness: Sherry [name] is a 38 y.o. who identifies as a female who was assigned female at birth, and is being seen today for URI with cough.  Sherry [name] is currently experiencing symptoms of red, itchy eyes, frontal headache pressure, sore throat, productive cough, nasal congestion. Taking liquid tylenol  and ibuprofen  with no relief of symptoms. COVID test was negative.   Problems:  Patient Active Problem List   Diagnosis Date Noted   Itching of ear 09/24/2022   Anxiety 07/23/2022   Moderate episode of recurrent major depressive disorder (HCC) 07/23/2022   Severe major depression, single episode, without psychotic features (HCC) 07/21/2022   Generalized anxiety disorder 07/21/2022   Essential hypertension 07/02/2022    Allergies:  Allergies  Allergen Reactions   Fish Allergy    Shellfish Allergy    Medications:  Current Outpatient Medications:    promethazine -dextromethorphan (PROMETHAZINE -DM) 6.25-15 MG/5ML syrup, Take 5 mLs by mouth 4 (four) times daily as needed., Disp: 240 mL, Rfl: 0   acetaminophen  (LIQUID ACETAMINOPHEN ) 160 MG/5ML liquid, Take by mouth., Disp: , Rfl:    clindamycin  (CLEOCIN  T) 1 % SWAB, apply to affected areas of face, chest & back every morning, Disp: 60 each, Rfl: 5   hydrochlorothiazide  (HYDRODIURIL ) 25 MG tablet, Take 1 tablet (25 mg  total) by mouth daily. One-time refill. Further fills to come from new PCP, Disp: 30 tablet, Rfl: 0   ipratropium (ATROVENT ) 0.03 % nasal spray, Place 2 sprays into both nostrils every 12 (twelve) hours., Disp: 30 mL, Rfl: 0   IRON, FERROUS SULFATE, PO, Take by mouth., Disp: , Rfl:    LORYNA 3-0.02 MG tablet, Take 1 tablet by mouth daily., Disp: , Rfl:    naproxen   (NAPROSYN ) 500 MG tablet, Take 1 tablet (500 mg total) by mouth 2 (two) times daily with a meal., Disp: 60 tablet, Rfl: 1   Safety Seal Miscellaneous MISC, [employer] Melaxamic cream - Tranexamic acid 5% Kojic acid USP 2% Vit C usp 2.5% hyaluronic acid excp 0.1% - apply to the face daily morning and night., Disp: 15 g, Rfl: 5   sertraline  (ZOLOFT ) 50 MG tablet, Take 1 tablet (50 mg total) by mouth daily., Disp: 30 tablet, Rfl: 1   traZODone  (DESYREL ) 50 MG tablet, Take 1 tablet (50 mg total) by mouth at bedtime., Disp: 30 tablet, Rfl: 1   tretinoin  (RETIN-A ) 0.05 % cream, Apply pea size amount to face 2 nights a week on Tu/Th. In one month increase to 3 nights a week on M/W/F. For body acne mix pea size amount with 1 pump of lotion to apply to chest and back., Disp: 45 g, Rfl: 2  Observations/Objective: Patient is well-developed, well-nourished in no acute distress.  Resting comfortably  at home.  Head is normocephalic, atraumatic.  No labored breathing. Speech is clear and coherent with logical content.  Patient is alert and oriented at baseline.    Assessment and Plan: 1. URI with cough and congestion (Primary) - promethazine -dextromethorphan (PROMETHAZINE -DM) 6.25-15 MG/5ML syrup; Take 5 mLs by mouth 4 (four) times daily as needed.  Dispense: 240 mL; Refill: 0  INSTRUCTIONS: use a humidifier for nasal congestion Drink plenty of fluids, rest and wash hands frequently to avoid the spread of infection Alternate tylenol  and Motrin  for relief of fever   Follow Up Instructions: I discussed the assessment and treatment plan with the patient. The patient was provided an opportunity to ask questions and all were answered. The patient agreed with the plan and demonstrated an understanding of the instructions.  A copy of instructions were sent to the patient via MyChart unless otherwise noted below.    The patient was advised to call back or seek an in-person evaluation if the symptoms worsen or if  the condition fails to improve as anticipated.    Sherry [name] W Sherry Goodie, NP

## 2024-03-29 ENCOUNTER — Ambulatory Visit: Admitting: Family Medicine

## 2024-04-23 ENCOUNTER — Telehealth: Admitting: Physician Assistant

## 2024-04-23 ENCOUNTER — Telehealth: Admitting: Family Medicine

## 2024-04-23 DIAGNOSIS — K047 Periapical abscess without sinus: Secondary | ICD-10-CM

## 2024-04-23 DIAGNOSIS — R21 Rash and other nonspecific skin eruption: Secondary | ICD-10-CM

## 2024-04-23 MED ORDER — NAPROXEN 500 MG PO TABS: 500.0000 mg | ORAL_TABLET | Freq: Two times a day (BID) | ORAL | 0 refills | Status: DC

## 2024-04-23 MED ORDER — AMOXICILLIN-POT CLAVULANATE 875-125 MG PO TABS: 1.0000 | ORAL_TABLET | Freq: Two times a day (BID) | ORAL | 0 refills | Status: DC

## 2024-04-23 NOTE — Progress Notes (Signed)
 E-Visit for Dental Pain  We are sorry that you are not feeling well.  Here is how we plan to help!  Based on what you have shared with me in the questionnaire, it sounds like you have a dental infection.  I have prescribed Augmentin  875-125mg  twice a day for 7 days and Naprosyn  500mg  2 times a day for 7 days for discomfort  It is imperative that you see a dentist within 10 days of this eVisit to determine the cause of the dental pain and be sure it is adequately treated  A toothache or tooth pain is caused when the nerve in the root of a tooth or surrounding a tooth is irritated. Dental (tooth) infection, decay, injury, or loss of a tooth are the most common causes of dental pain. Pain may also occur after an extraction (tooth is pulled out). Pain sometimes originates from other areas and radiates to the jaw, thus appearing to be tooth pain.Bacteria growing inside your mouth can contribute to gum disease and dental decay, both of which can cause pain. A toothache occurs from inflammation of the central portion of the tooth called pulp. The pulp contains nerve endings that are very sensitive to pain. Inflammation to the pulp or pulpitis may be caused by dental cavities, trauma, and infection.    HOME CARE:   For toothaches: Over-the-counter pain medications such as acetaminophen  or ibuprofen  may be used. Take these as directed on the package while you arrange for a dental appointment. Avoid very cold or hot foods, because they may make the pain worse. You may get relief from biting on a cotton ball soaked in oil of cloves. You can get oil of cloves at most drug stores.  For jaw pain:  Aspirin may be helpful for problems in the joint of the jaw in adults. If pain happens every time you open your mouth widely, the temporomandibular joint (TMJ) may be the source of the pain. Yawning or taking a large bite of food may worsen the pain. An appointment with your doctor or dentist will help you find the  cause.     GET HELP RIGHT AWAY IF:  You have a high fever or chills If you have had a recent head or face injury and develop headache, light headedness, nausea, vomiting, or other symptoms that concern you after an injury to your face or mouth, you could have a more serious injury in addition to your dental injury. A facial rash associated with a toothache: This condition may improve with medication. Contact your doctor for them to decide what is appropriate. Any jaw pain occurring with chest pain: Although jaw pain is most commonly caused by dental disease, it is sometimes referred pain from other areas. People with heart disease, especially people who have had stents placed, people with diabetes, or those who have had heart surgery may have jaw pain as a symptom of heart attack or angina. If your jaw or tooth pain is associated with lightheadedness, sweating, or shortness of breath, you should see a doctor as soon as possible. Trouble swallowing or excessive pain or bleeding from gums: If you have a history of a weakened immune system, diabetes, or steroid use, you may be more susceptible to infections. Infections can often be more severe and extensive or caused by unusual organisms. Dental and gum infections in people with these conditions may require more aggressive treatment. An abscess may need draining or IV antibiotics, for example.  MAKE SURE YOU  Understand these instructions. Will watch your condition. Will get help right away if you are not doing well or get worse.  Thank you for choosing an e-visit.  Your e-visit answers were reviewed by a board certified advanced clinical practitioner to complete your personal care plan. Depending upon the condition, your plan could have included both over the counter or prescription medications.  Please review your pharmacy choice. Make sure the pharmacy is open so you can pick up prescription now. If there is a problem, you may contact your  provider through [employer] and have the prescription routed to another pharmacy.  Your safety is important to us . If you have drug allergies check your prescription carefully.   For the next 24 hours you can use MyChart to ask questions about today's visit, request a non-urgent call back, or ask for a work or school excuse. You will get an email in the next two days asking about your experience. I hope that your e-visit has been valuable and will speed your recovery.  I have spent 5 minutes in review of e-visit questionnaire, review and updating patient chart, medical decision making and response to patient.   Elsie Velma Lunger, PA-C

## 2024-04-24 ENCOUNTER — Ambulatory Visit: Admitting: Dermatology

## 2024-04-24 MED ORDER — TRIAMCINOLONE ACETONIDE 0.1 % EX CREA: 1.0000 | TOPICAL_CREAM | Freq: Two times a day (BID) | CUTANEOUS | 0 refills | Status: AC

## 2024-04-24 NOTE — Progress Notes (Signed)
 E Visit for Rash  We are sorry that you are not feeling well. Here is how we plan to help!  Based on what you shared with me it looks like you have contact dermatitis.  Contact dermatitis is a skin rash caused by something that touches the skin and causes irritation or inflammation.  Your skin may be red, swollen, dry, cracked, and itch.  The rash should go away in a few days but can last a few weeks.  If you get a rash, it's important to figure out what caused it so the irritant can be avoided in the future. I am prescribing triamcinolone 0.1 % cream -- apply to the affected area(s) in a thin layer, twice daily for up to 14 days. Do not apply to privates.    HOME CARE:  Take cool showers and avoid direct sunlight. Apply cool compress or wet dressings. Take a bath in an oatmeal bath.  Sprinkle content of one Aveeno packet under running faucet with comfortably warm water.  Bathe for 15-20 minutes, 1-2 times daily.  Pat dry with a towel. Do not rub the rash. Use hydrocortisone cream. Take an antihistamine like Benadryl for widespread rashes that itch.  The adult dose of Benadryl is 25-50 mg by mouth 4 times daily. Caution:  This type of medication may cause sleepiness.  Do not drink alcohol, drive, or operate dangerous machinery while taking antihistamines.  Do not take these medications if you have prostate enlargement.  Read package instructions thoroughly on all medications that you take.  GET HELP RIGHT AWAY IF:  Symptoms don't go away after treatment. Severe itching that persists. If you rash spreads or swells. If you rash begins to smell. If it blisters and opens or develops a yellow-brown crust. You develop a fever. You have a sore throat. You become short of breath.  MAKE SURE YOU:  Understand these instructions. Will watch your condition. Will get help right away if you are not doing well or get worse.  Thank you for choosing an e-visit. Your e-visit answers were reviewed by  a board certified advanced clinical practitioner to complete your personal care plan. Depending upon the condition, your plan could have included both over the counter or prescription medications. Please review your pharmacy choice. Be sure that the pharmacy you have chosen is open so that you can pick up your prescription now.  If there is a problem you may message your provider in MyChart to have the prescription routed to another pharmacy. Your safety is important to us . If you have drug allergies check your prescription carefully.  For the next 24 hours, you can use MyChart to ask questions about today's visit, request a non-urgent call back, or ask for a work or school excuse from your e-visit provider. You will get an email in the next two days asking about your experience. I hope that your e-visit has been valuable and will speed your recovery.  I have spent 5 minutes in review of e-visit questionnaire, review and updating patient chart, medical decision making and response to patient.   Elsie Velma Lunger, PA-C

## 2024-04-27 ENCOUNTER — Encounter

## 2024-04-27 MED ORDER — AMOXICILLIN-POT CLAVULANATE 875-125 MG PO TABS: 1.0000 | ORAL_TABLET | Freq: Two times a day (BID) | ORAL | 0 refills | Status: DC

## 2024-04-27 NOTE — Addendum Note (Signed)
 Addended by: NEOMI RAISIN Z on: 04/27/2024 04:34 PM   Modules accepted: Orders

## 2024-06-18 ENCOUNTER — Telehealth: Admitting: Physician Assistant

## 2024-06-18 DIAGNOSIS — J3089 Other allergic rhinitis: Secondary | ICD-10-CM

## 2024-06-18 NOTE — Progress Notes (Signed)
 E visit for Allergic Rhinitis We are sorry that you are not feeling well.  Here is how we plan to help!  Based on what you have shared with me it looks like you have Allergic Rhinitis.  Rhinitis is when a reaction occurs that causes nasal congestion, runny nose, sneezing, and itching.  Most types of rhinitis are caused by an inflammation and are associated with symptoms in the eyes ears or throat. There are several types of rhinitis.  The most common are acute rhinitis, which is usually caused by a viral illness, allergic or seasonal rhinitis, and nonallergic or year-round rhinitis.  Nasal allergies occur certain times of the year.  Allergic rhinitis is caused when allergens in the air trigger the release of histamine in the body.  Histamine causes itching, swelling, and fluid to build up in the fragile linings of the nasal passages, sinuses and eyelids.  An itchy nose and clear discharge are common.  I recommend the following over the counter treatments: You should take a daily dose of antihistamine -- either OTC Claritin or Zyrtec  once daily.  I also would recommend a nasal spray: Flonase  2 sprays into each nostril once daily and Saline 1 spray into each nostril as needed  HOME CARE:  You can use an over-the-counter saline nasal spray as needed Avoid areas where there is heavy dust, mites, or molds Stay indoors on windy days during the pollen season Keep windows closed in home, at least in bedroom; use air conditioner. Use high-efficiency house air filter Keep windows closed in car, turn AC on re-circulate Avoid playing out with dog during pollen season  GET HELP RIGHT AWAY IF:  If your symptoms do not improve within 10 days You become short of breath You develop yellow or green discharge from your nose for over 3 days You have coughing fits  MAKE SURE YOU:  Understand these instructions Will watch your condition Will get help right away if you are not doing well or get  worse  Thank you for choosing an e-visit. Your e-visit answers were reviewed by a board certified advanced clinical practitioner to complete your personal care plan. Depending upon the condition, your plan could have included both over the counter or prescription medications. Please review your pharmacy choice. Be sure that the pharmacy you have chosen is open so that you can pick up your prescription now.  If there is a problem you may message your provider in MyChart to have the prescription routed to another pharmacy. Your safety is important to us . If you have drug allergies check your prescription carefully.  For the next 24 hours, you can use MyChart to ask questions about today's visit, request a non-urgent call back, or ask for a work or school excuse from your e-visit provider. You will get an email in the next two days asking about your experience. I hope that your e-visit has been valuable and will speed your recovery.   I have spent 5 minutes in review of e-visit questionnaire, review and updating patient chart, medical decision making and response to patient.   Elsie Velma Lunger, PA-C

## 2024-07-03 ENCOUNTER — Telehealth: Admitting: Physician Assistant

## 2024-07-03 DIAGNOSIS — H6502 Acute serous otitis media, left ear: Secondary | ICD-10-CM

## 2024-07-03 MED ORDER — AMOXICILLIN-POT CLAVULANATE 875-125 MG PO TABS: 1.0000 | ORAL_TABLET | Freq: Two times a day (BID) | ORAL | 0 refills | Status: AC

## 2024-07-03 MED ORDER — CIPROFLOXACIN-DEXAMETHASONE 0.3-0.1 % OT SUSP: 4.0000 [drp] | Freq: Two times a day (BID) | OTIC | 0 refills | Status: AC

## 2024-07-03 NOTE — Progress Notes (Signed)

## 2024-07-22 ENCOUNTER — Telehealth: Payer: Self-pay | Admitting: Family

## 2024-07-22 DIAGNOSIS — M5441 Lumbago with sciatica, right side: Secondary | ICD-10-CM

## 2024-07-22 MED ORDER — BACLOFEN 10 MG PO TABS: 10.0000 mg | ORAL_TABLET | Freq: Three times a day (TID) | ORAL | 0 refills | Status: AC

## 2024-07-22 MED ORDER — NAPROXEN 500 MG PO TABS: 500.0000 mg | ORAL_TABLET | Freq: Two times a day (BID) | ORAL | 0 refills | Status: AC

## 2024-07-22 NOTE — Progress Notes (Signed)
 We are sorry that you are not feeling well.  Here is how we plan to help!  Based on what you have shared with me it, appears you may be experiencing acute back pain.  I do recommend, if you pain continues or worsens that you be seen in person for in-person exam and possible x-rays.   Acute back pain is defined as musculoskeletal pain that can resolve in 1-3 weeks with conservative treatment.  I have prescribed a non-steroid anti-inflammatory (NSAID) Naprosyn  500 mg -- take one by mouth twice a day, as well as a muscle relaxant, Baclofen  10 mg every eight hours as needed. Some patients experience stomach irritation or in increased heartburn with anti-inflammatory drugs.  Please keep in mind that muscle relaxer's can cause fatigue and should not be taken while at work or driving.  Back pain is very common.  The pain often gets better over time.  The cause of back pain is usually not dangerous.  Most people can learn to manage their back pain on their own.  Home Care Stay active.  Start with short walks on flat ground if you can.  Try to walk farther each day. Do not sit, drive or stand in one place for more than 30 minutes.  Do not stay in bed. Do not fully avoid exercise or work.  Activity can help your back heal faster. Be careful when you bend or lift an object.  Bend at your knees, keep the object close to you, and do not twist. Sleep on a firm mattress.  Lie on your side, and bend your knees.  If you lie on your back, put a pillow under your knees. Only take medicines as told by your doctor. Put ice on the injured area. Put ice in a plastic bag Place a towel between your skin and the bag Leave the ice on for 15-20 minutes, 3-4 times a day for the first 2-3 days.  After that, you can switch between ice and heat packs. Ask your doctor about back exercises or massage.   Get Help Right Way If: Your pain does not go away with rest and treatments given today. Your pain does not go away within 1  week. You have new problems. You do not feel well. The pain spreads into your legs. You cannot control when you poop (bowel movement) or pee (urinate). You feel sick to your stomach (nauseous) or throw up (vomit). You have belly (abdominal) pain. You feel like you may pass out (faint). If you develop a fever.  Make Sure you: Understand these instructions. Continue to monitor your condition for any changes. Will get help right away if you are not doing well or get worse.  Your e-visit answers were reviewed by a board certified advanced clinical practitioner to complete your personal care plan.  Depending on the condition, your plan could have included both over the counter or prescription medications.  If there is a problem, please reply once you have received a response from your provider.  Your safety is important to us .  If you have drug allergies, check your prescription carefully.    You can use MyChart to ask questions about todays visit, request a non-urgent call back, or ask for a work or school excuse for 24 hours related to this e-Visit. If it has been greater than 24 hours you will need to follow up with your provider or enter a new e-Visit to address those concerns.  You will get an e-mail  in the next two days asking about your experience.  I hope that your e-visit has been valuable and will speed your recovery. Thank you for using e-visits.   I have spent 5 minutes in review of e-visit questionnaire, review and updating patient chart, medical decision making and response to patient.   Bari Learn, FNP

## 2024-08-17 ENCOUNTER — Telehealth: Admitting: Student

## 2024-08-17 DIAGNOSIS — M5441 Lumbago with sciatica, right side: Secondary | ICD-10-CM

## 2024-08-17 NOTE — Progress Notes (Signed)
" °  Because your back and leg pain is not better after the treatment prescribed 07/22/24, I feel your condition warrants further evaluation and I recommend that you be seen in a face-to-face visit. You need to have imaging of your back. I would recommend you go to an urgent care, primary care, or emergency room with xray capabilities.   NOTE: There will be NO CHARGE for this E-Visit   If you are having a true medical emergency, please call 911.     For an urgent face to face visit, [facility] has multiple urgent care centers for your convenience.  Click the link below for the full list of locations and hours, walk-in wait times, appointment scheduling options and driving directions:  Urgent Care - [place], [place], [place], [place], [place], [place]  [place]     Your MyChart E-visit questionnaire answers were reviewed by a board certified advanced clinical practitioner to complete your personal care plan based on your specific symptoms.    Thank you for using e-Visits.    "
